# Patient Record
Sex: Male | Born: 1955 | Race: Black or African American | Hispanic: No | Marital: Single | Smoking: Current every day smoker
Health system: Southern US, Community
[De-identification: ages and names within clinical notes are randomized; demographics above are authoritative.]

---

## 2010-12-18 ENCOUNTER — Emergency Department: Payer: Self-pay | Admitting: Internal Medicine

## 2018-07-17 ENCOUNTER — Emergency Department
Admission: EM | Admit: 2018-07-17 | Discharge: 2018-07-17 | Discharge status: Against Medical Advice | Payer: Self-pay | Attending: Emergency Medicine | Admitting: Emergency Medicine

## 2018-07-17 ENCOUNTER — Emergency Department: Payer: Self-pay

## 2018-07-17 ENCOUNTER — Encounter: Payer: Self-pay | Admitting: Emergency Medicine

## 2018-07-17 DIAGNOSIS — R42 Dizziness and giddiness: Secondary | ICD-10-CM | POA: Insufficient documentation

## 2018-07-17 DIAGNOSIS — Z5321 Procedure and treatment not carried out due to patient leaving prior to being seen by health care provider: Secondary | ICD-10-CM | POA: Insufficient documentation

## 2018-07-17 LAB — BASIC METABOLIC PANEL
ANION GAP: 11 (ref 5–15)
BUN: 17 mg/dL (ref 8–23)
CHLORIDE: 106 mmol/L (ref 98–111)
CO2: 22 mmol/L (ref 22–32)
Calcium: 9.5 mg/dL (ref 8.9–10.3)
Creatinine, Ser: 0.89 mg/dL (ref 0.61–1.24)
GFR calc Af Amer: >60 mL/min (ref >60)
GLUCOSE: 122 mg/dL — AB (ref 70–99)
POTASSIUM: 4.1 mmol/L (ref 3.5–5.1)
Sodium: 139 mmol/L (ref 135–145)

## 2018-07-17 LAB — CBC
HEMATOCRIT: 54.1 % — AB (ref 40.0–52.0)
Hemoglobin: 18.2 g/dL — ABNORMAL HIGH (ref 13.0–18.0)
MCH: 30.4 pg (ref 26.0–34.0)
MCHC: 33.6 g/dL (ref 32.0–36.0)
MCV: 90.4 fL (ref 80.0–100.0)
PLATELETS: 202 10*3/uL (ref 150–440)
RBC: 5.98 MIL/uL — ABNORMAL HIGH (ref 4.40–5.90)
RDW: 14.8 % — AB (ref 11.5–14.5)
WBC: 5.3 10*3/uL (ref 3.8–10.6)

## 2018-07-17 LAB — GLUCOSE, CAPILLARY: GLUCOSE-CAPILLARY: 131 mg/dL — AB (ref 70–99)

## 2018-07-17 LAB — TROPONIN I: Troponin I: <0.03 ng/mL (ref <0.03)

## 2018-07-17 NOTE — ED Triage Notes (Signed)
First Nurse Note: C/O dizziness for the past 'few weeks".   AAOx3.  Skin warm and dry.  NAD.  MAE equally and strong.  Gait steady.

## 2018-07-17 NOTE — ED Triage Notes (Signed)
Pt arrives with complaints of dizziness for the last few weeks. Pt reports poor appetite. Pt also reports intermittent left sides chest pain.

## 2020-05-11 IMAGING — CR DG CHEST 2V
1 series · 2 of 2 positions shown · non-contrast
Comparison: None.

CLINICAL DATA: Dizziness for a few weeks, poor appetite.
Intermittent LEFT chest pain.

EXAM:
CHEST - 2 VIEW

[Series 1: dg chest 2 view · 0.14mm/px · 2 of 2 slices shown]
[im 1/2]
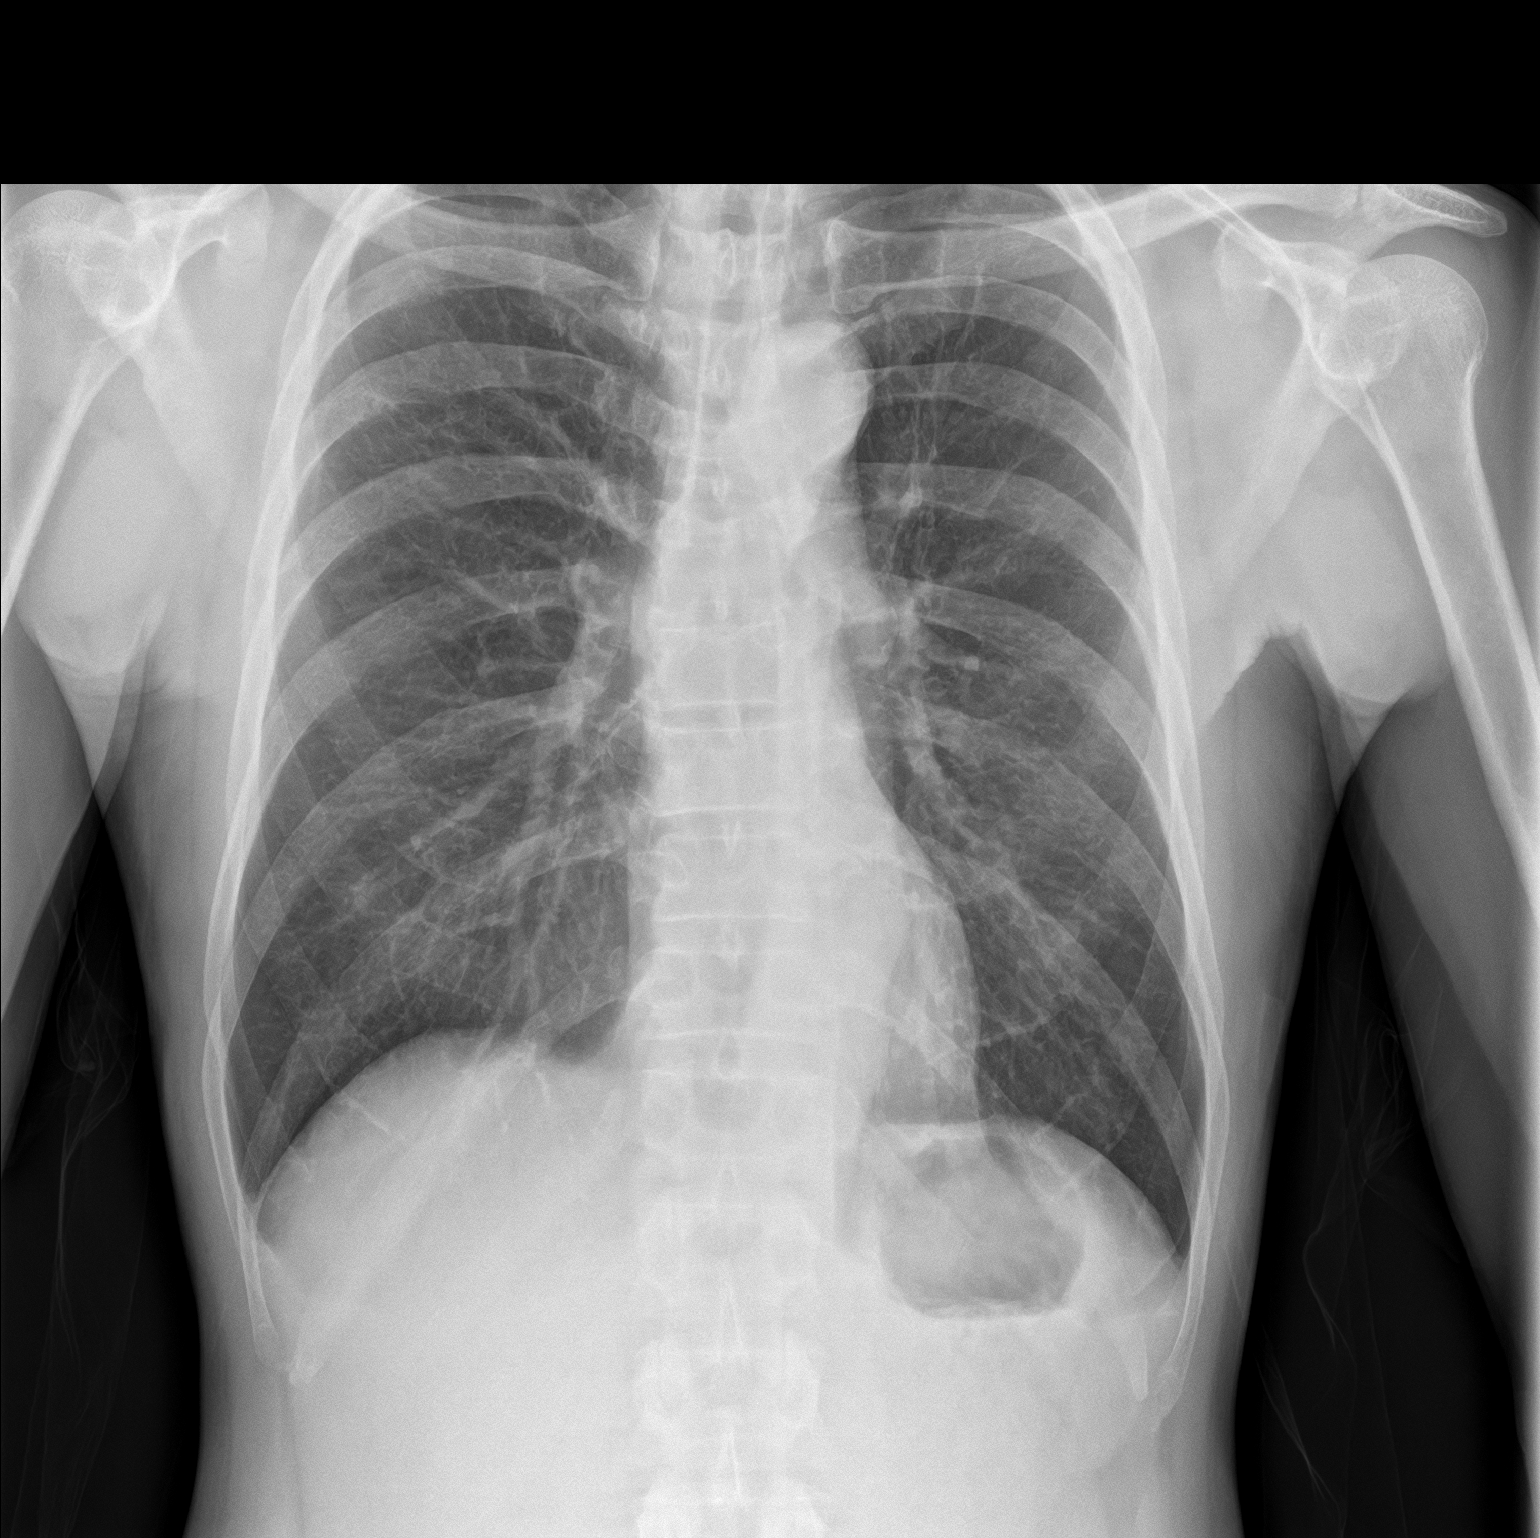
[im 2/2]
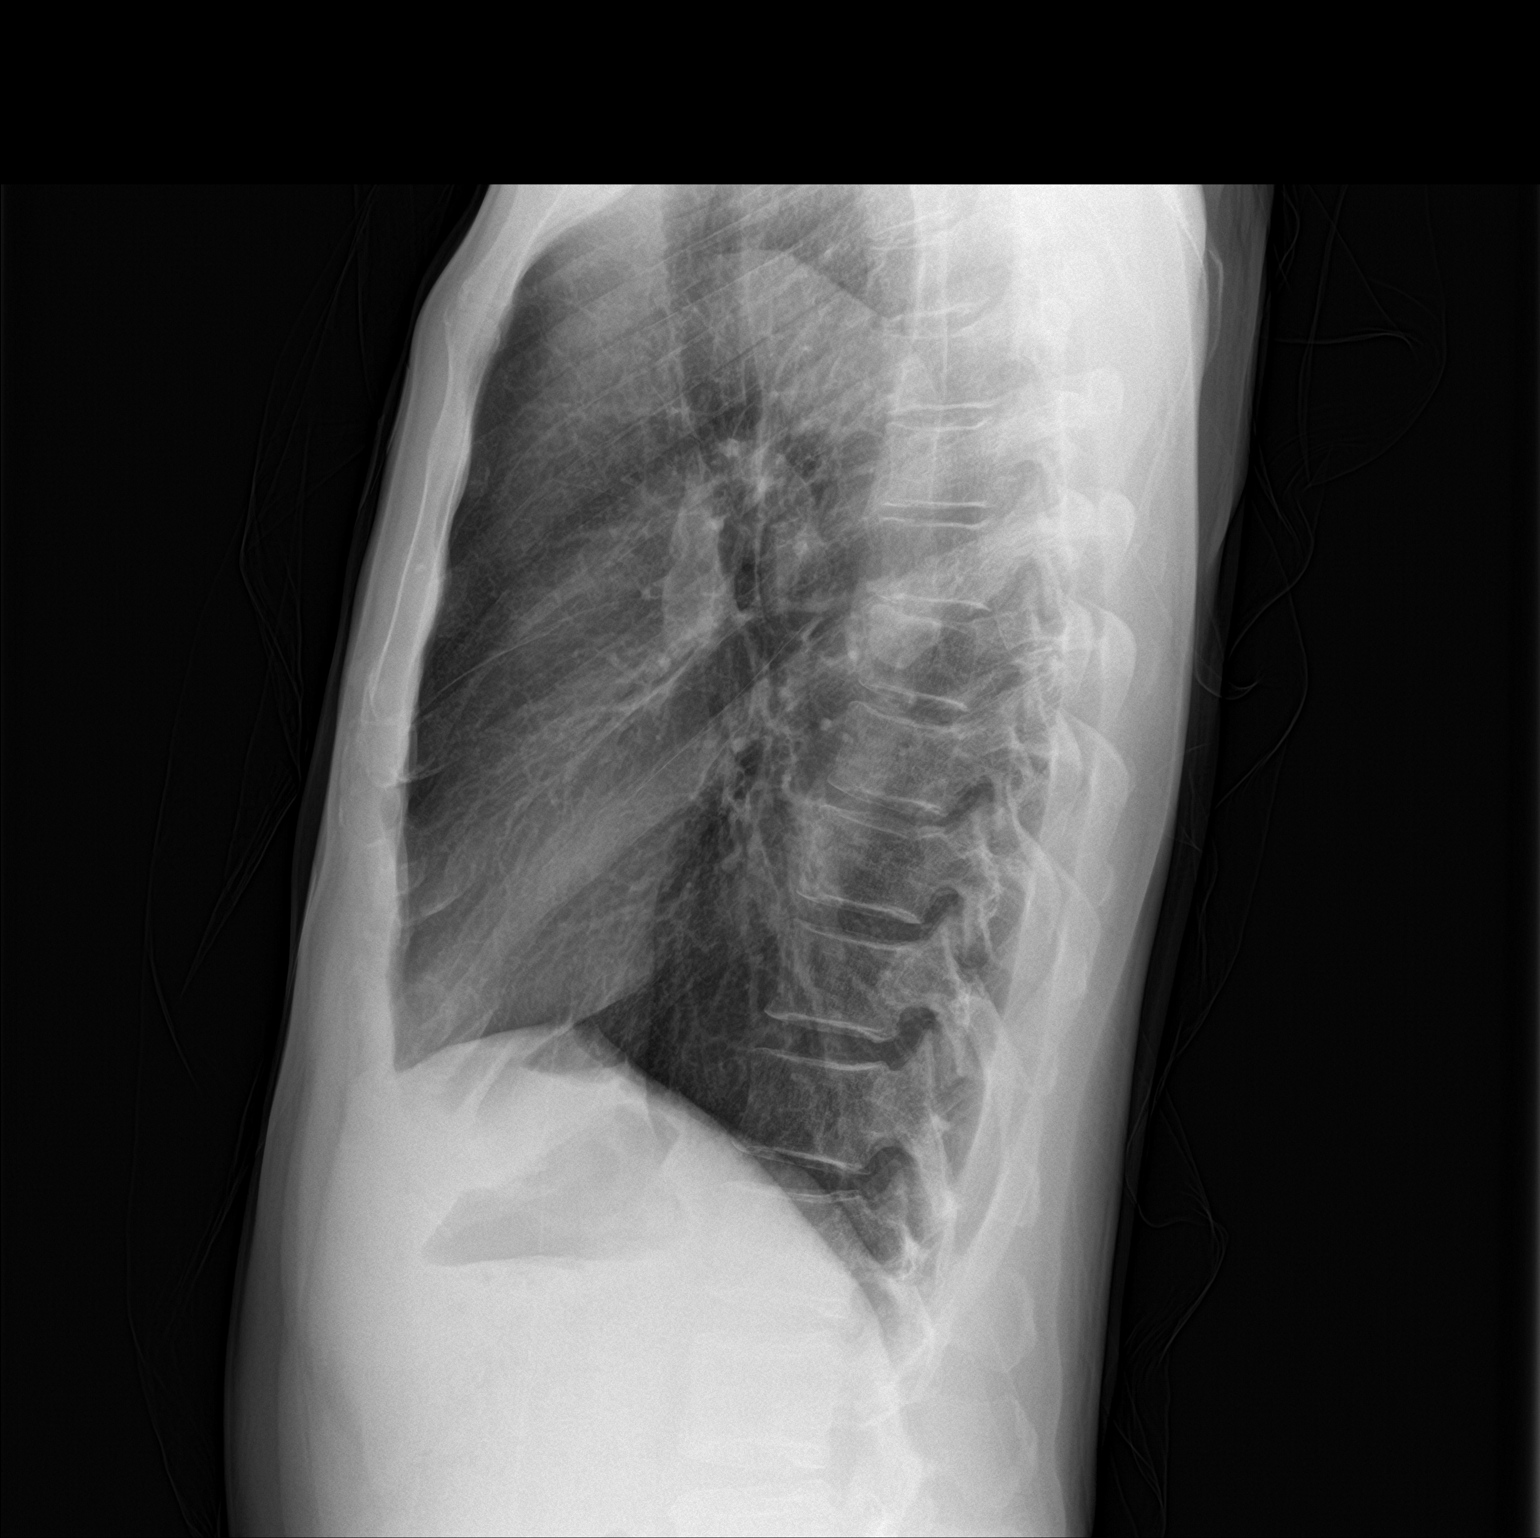

[2 of 2 positions shown; findings below may reference images not displayed]

FINDINGS: Cardiomediastinal silhouette is normal. No pleural effusions or
focal consolidations. Minimal RIGHT upper lobe atelectasis versus
scarring. Trachea projects midline and there is no pneumothorax.
Soft tissue planes and included osseous structures are
non-suspicious.
IMPRESSION: No active cardiopulmonary disease.

## 2021-12-31 DIAGNOSIS — H2513 Age-related nuclear cataract, bilateral: Secondary | ICD-10-CM | POA: Diagnosis not present

## 2021-12-31 DIAGNOSIS — Z0100 Encounter for examination of eyes and vision without abnormal findings: Secondary | ICD-10-CM | POA: Diagnosis not present

## 2021-12-31 DIAGNOSIS — I10 Essential (primary) hypertension: Secondary | ICD-10-CM | POA: Diagnosis not present

## 2021-12-31 DIAGNOSIS — H5203 Hypermetropia, bilateral: Secondary | ICD-10-CM | POA: Diagnosis not present

## 2022-03-28 NOTE — Progress Notes (Unsigned)
   There were no vitals taken for this visit.   Subjective:    Patient ID: Mitchell Whitaker, male    DOB: 12-Jan-1956, 66 y.o.   MRN: [id number]  HPI: Mitchell Whitaker is a 66 y.o. male  No chief complaint on file.  Patient presents to clinic to establish care with new PCP.  Introduced to Publishing rights manager role and practice setting.  All questions answered.  Discussed provider/patient relationship and expectations.  Patient reports a history of ***. Patient denies a history of: Hypertension, Elevated Cholesterol, Diabetes, Thyroid problems, Depression, Anxiety, Neurological problems, and Abdominal problems.   Active Ambulatory Problems    Diagnosis Date Noted   No Active Ambulatory Problems   Resolved Ambulatory Problems    Diagnosis Date Noted   No Resolved Ambulatory Problems   No Additional Past Medical History   No past surgical history on file. No family history on file.   Review of Systems  Per HPI unless specifically indicated above     Objective:    There were no vitals taken for this visit.  Wt Readings from Last 3 Encounters:  07/17/18 128 lb (58.1 kg)    Physical Exam  Results for orders placed or performed during the hospital encounter of 07/17/18  Glucose, capillary  Result Value Ref Range   Glucose-Capillary 131 (H) 70 - 99 mg/dL  Basic metabolic panel  Result Value Ref Range   Sodium 139 135 - 145 mmol/L   Potassium 4.1 3.5 - 5.1 mmol/L   Chloride 106 98 - 111 mmol/L   CO2 22 22 - 32 mmol/L   Glucose, Bld 122 (H) 70 - 99 mg/dL   BUN 17 8 - 23 mg/dL   Creatinine, Ser 6.83 0.61 - 1.24 mg/dL   Calcium 9.5 8.9 - 41.9 mg/dL   GFR calc non Af Amer >60 >60 mL/min   GFR calc Af Amer >60 >60 mL/min   Anion gap 11 5 - 15  CBC  Result Value Ref Range   WBC 5.3 3.8 - 10.6 K/uL   RBC 5.98 (H) 4.40 - 5.90 MIL/uL   Hemoglobin 18.2 (H) 13.0 - 18.0 g/dL   HCT 62.2 (H) 29.7 - 98.9 %   MCV 90.4 80.0 - 100.0 fL   MCH 30.4 26.0 - 34.0 pg   MCHC 33.6 32.0 - 36.0  g/dL   RDW 21.1 (H) 94.1 - 74.0 %   Platelets 202 150 - 440 K/uL  Troponin I  Result Value Ref Range   Troponin I <0.03 <0.03 ng/mL      Assessment & Plan:   Problem List Items Addressed This Visit   None Visit Diagnoses     Encounter to establish care    -  Primary        Follow up plan: No follow-ups on file.

## 2022-03-29 ENCOUNTER — Encounter: Payer: Self-pay | Admitting: Nurse Practitioner

## 2022-03-29 ENCOUNTER — Ambulatory Visit (INDEPENDENT_AMBULATORY_CARE_PROVIDER_SITE_OTHER): Payer: Medicare HMO | Admitting: Nurse Practitioner

## 2022-03-29 VITALS — BP 153/96 | HR 76 | Temp 98.5°F | Ht 69.29 in | Wt 144.8 lb

## 2022-03-29 DIAGNOSIS — N3943 Post-void dribbling: Secondary | ICD-10-CM

## 2022-03-29 DIAGNOSIS — Z7689 Persons encountering health services in other specified circumstances: Secondary | ICD-10-CM

## 2022-03-29 DIAGNOSIS — I10 Essential (primary) hypertension: Secondary | ICD-10-CM | POA: Insufficient documentation

## 2022-03-29 DIAGNOSIS — F17200 Nicotine dependence, unspecified, uncomplicated: Secondary | ICD-10-CM | POA: Diagnosis not present

## 2022-03-29 MED ORDER — AMLODIPINE BESYLATE 5 MG PO TABS: 5.0000 mg | ORAL_TABLET | Freq: Every day | ORAL | 0 refills | Status: DC

## 2022-03-29 NOTE — Assessment & Plan Note (Addendum)
Chronic. Not well controlled.  Will start Amlodipine 5mg  daily.  Side effects and benefits discussed during visit today.  Will draw labs at next visit.  Follow up in 1 month for reevaluation.  Patient requested referral to Cardiology due to family history of heart attack.

## 2022-03-29 NOTE — Assessment & Plan Note (Signed)
Chronic. Smokes about 1ppd.  Will need CT lung.  Patient is limited on time today. Will discuss next visit at his Physical.

## 2022-04-11 ENCOUNTER — Ambulatory Visit: Payer: Medicare HMO | Admitting: Urology

## 2022-04-19 ENCOUNTER — Encounter: Payer: Self-pay | Admitting: Urology

## 2022-05-08 NOTE — Progress Notes (Unsigned)
There were no vitals taken for this visit.   Subjective:    Patient ID: Mitchell Whitaker, male    DOB: 1956/08/16, 66 y.o.   MRN: [id number]  HPI: Mitchell Whitaker is a 66 y.o. male presenting on 05/09/2022 for comprehensive medical examination. Current medical complaints include:{Blank single:19197::"none","***"}  He currently lives with: Interim Problems from his last visit: {Blank single:19197::"yes","no"}  HYPERTENSION Hypertension status: {Blank single:19197::"controlled","uncontrolled","better","worse","exacerbated","stable"}  Satisfied with current treatment? {Blank single:19197::"yes","no"} Duration of hypertension: {Blank single:19197::"chronic","months","years"} BP monitoring frequency:  {Blank single:19197::"not checking","rarely","daily","weekly","monthly","a few times a day","a few times a week","a few times a month"} BP range:  BP medication side effects:  {Blank single:19197::"yes","no"} Medication compliance: {Blank single:19197::"excellent compliance","good compliance","fair compliance","poor compliance"} Previous BP meds:{Blank multiple:19196::"none","amlodipine","amlodipine/benazepril","atenolol","benazepril","benazepril/HCTZ","bisoprolol (bystolic)","carvedilol","chlorthalidone","clonidine","diltiazem","exforge HCT","HCTZ","irbesartan (avapro)","labetalol","lisinopril","lisinopril-HCTZ","losartan (cozaar)","methyldopa","nifedipine","olmesartan (benicar)","olmesartan-HCTZ","quinapril","ramipril","spironalactone","tekturna","valsartan","valsartan-HCTZ","verapamil"} Aspirin: {Blank single:19197::"yes","no"} Recurrent headaches: {Blank single:19197::"yes","no"} Visual changes: {Blank single:19197::"yes","no"} Palpitations: {Blank single:19197::"yes","no"} Dyspnea: {Blank single:19197::"yes","no"} Chest pain: {Blank single:19197::"yes","no"} Lower extremity edema: {Blank single:19197::"yes","no"} Dizzy/lightheaded: {Blank single:19197::"yes","no"}  Depression Screen done  today and results listed below:     03/29/2022    9:19 AM  Depression screen PHQ 2/9  Decreased Interest 1  Down, Depressed, Hopeless 1  PHQ - 2 Score 2  Altered sleeping 0  Tired, decreased energy 3  Change in appetite 0  Feeling bad or failure about yourself  0  Trouble concentrating 0  Moving slowly or fidgety/restless 0  Suicidal thoughts 0  PHQ-9 Score 5  Difficult doing work/chores Somewhat difficult    The patient {has/does not UDJS:97026} a history of falls. I {did/did not:19850} complete a risk assessment for falls. A plan of care for falls {was/was not:19852} documented.   Past Medical History:  No past medical history on file.  Surgical History:  No past surgical history on file.  Medications:  Current Outpatient Medications on File Prior to Visit  Medication Sig   amLODipine (NORVASC) 5 MG tablet Take 1 tablet (5 mg total) by mouth daily.   No current facility-administered medications on file prior to visit.    Allergies:  Not on File  Social History:  Social History   Socioeconomic History   Marital status: Single    Spouse name: Not on file   Number of children: Not on file   Years of education: Not on file   Highest education level: Not on file  Occupational History   Not on file  Tobacco Use   Smoking status: Every Day    Types: Cigars   Smokeless tobacco: Never  Vaping Use   Vaping Use: Never used  Substance and Sexual Activity   Alcohol use: Yes   Drug use: Never   Sexual activity: Yes  Other Topics Concern   Not on file  Social History Narrative   Not on file   Social Determinants of Health   Financial Resource Strain: Not on file  Food Insecurity: Not on file  Transportation Needs: Not on file  Physical Activity: Not on file  Stress: Not on file  Social Connections: Not on file  Intimate Partner Violence: Not on file   Social History   Tobacco Use  Smoking Status Every Day   Types: Cigars  Smokeless Tobacco Never    Social History   Substance and Sexual Activity  Alcohol Use Yes    Family History:  No family history on file.  Past medical history, surgical history, medications, allergies, family history and social history reviewed with patient today and changes made to appropriate areas of the chart.   ROS All other ROS negative except what is  listed above and in the HPI.      Objective:    There were no vitals taken for this visit.  Wt Readings from Last 3 Encounters:  03/29/22 144 lb 12.8 oz (65.7 kg)  07/17/18 128 lb (58.1 kg)    Physical Exam  Results for orders placed or performed during the hospital encounter of 07/17/18  Glucose, capillary  Result Value Ref Range   Glucose-Capillary 131 (H) 70 - 99 mg/dL  Basic metabolic panel  Result Value Ref Range   Sodium 139 135 - 145 mmol/L   Potassium 4.1 3.5 - 5.1 mmol/L   Chloride 106 98 - 111 mmol/L   CO2 22 22 - 32 mmol/L   Glucose, Bld 122 (H) 70 - 99 mg/dL   BUN 17 8 - 23 mg/dL   Creatinine, Ser 2.97 0.61 - 1.24 mg/dL   Calcium 9.5 8.9 - 98.9 mg/dL   GFR calc non Af Amer >60 >60 mL/min   GFR calc Af Amer >60 >60 mL/min   Anion gap 11 5 - 15  CBC  Result Value Ref Range   WBC 5.3 3.8 - 10.6 K/uL   RBC 5.98 (H) 4.40 - 5.90 MIL/uL   Hemoglobin 18.2 (H) 13.0 - 18.0 g/dL   HCT 21.1 (H) 94.1 - 74.0 %   MCV 90.4 80.0 - 100.0 fL   MCH 30.4 26.0 - 34.0 pg   MCHC 33.6 32.0 - 36.0 g/dL   RDW 81.4 (H) 48.1 - 85.6 %   Platelets 202 150 - 440 K/uL  Troponin I  Result Value Ref Range   Troponin I <0.03 <0.03 ng/mL      Assessment & Plan:   Problem List Items Addressed This Visit       Cardiovascular and Mediastinum   Primary hypertension   Other Visit Diagnoses     Annual physical exam    -  Primary        Discussed aspirin prophylaxis for myocardial infarction prevention and decision was {Blank single:19197::"it was not indicated","made to continue ASA","made to start ASA","made to stop ASA","that we recommended  ASA, and patient refused"}  LABORATORY TESTING:  Health maintenance labs ordered today as discussed above.   The natural history of prostate cancer and ongoing controversy regarding screening and potential treatment outcomes of prostate cancer has been discussed with the patient. The meaning of a false positive PSA and a false negative PSA has been discussed. He indicates understanding of the limitations of this screening test and wishes *** to proceed with screening PSA testing.   IMMUNIZATIONS:   - Tdap: Tetanus vaccination status reviewed: {tetanus status:315746}. - Influenza: {Blank single:19197::"Up to date","Administered today","Postponed to flu season","Refused","Given elsewhere"} - Pneumovax: {Blank single:19197::"Up to date","Administered today","Not applicable","Refused","Given elsewhere"} - Prevnar: {Blank single:19197::"Up to date","Administered today","Not applicable","Refused","Given elsewhere"} - COVID: {Blank single:19197::"Up to date","Administered today","Not applicable","Refused","Given elsewhere"} - HPV: {Blank single:19197::"Up to date","Administered today","Not applicable","Refused","Given elsewhere"} - Shingrix vaccine: {Blank single:19197::"Up to date","Administered today","Not applicable","Refused","Given elsewhere"}  SCREENING: - Colonoscopy: {Blank single:19197::"Up to date","Ordered today","Not applicable","Refused","Done elsewhere"}  Discussed with patient purpose of the colonoscopy is to detect colon cancer at curable precancerous or early stages   - AAA Screening: {Blank single:19197::"Up to date","Ordered today","Not applicable","Refused","Done elsewhere"}  -Hearing Test: {Blank single:19197::"Up to date","Ordered today","Not applicable","Refused","Done elsewhere"}  -Spirometry: {Blank single:19197::"Up to date","Ordered today","Not applicable","Refused","Done elsewhere"}   PATIENT COUNSELING:    Sexuality: Discussed sexually transmitted diseases, partner  selection, use of condoms, avoidance of unintended pregnancy  and contraceptive alternatives.   Advised to avoid cigarette  smoking.  I discussed with the patient that most people either abstain from alcohol or drink within safe limits (<=14/week and <=4 drinks/occasion for males, <=7/weeks and <= 3 drinks/occasion for females) and that the risk for alcohol disorders and other health effects rises proportionally with the number of drinks per week and how often a drinker exceeds daily limits.  Discussed cessation/primary prevention of drug use and availability of treatment for abuse.   Diet: Encouraged to adjust caloric intake to maintain  or achieve ideal body weight, to reduce intake of dietary saturated fat and total fat, to limit sodium intake by avoiding high sodium foods and not adding table salt, and to maintain adequate dietary potassium and calcium preferably from fresh fruits, vegetables, and low-fat dairy products.    stressed the importance of regular exercise  Injury prevention: Discussed safety belts, safety helmets, smoke detector, smoking near bedding or upholstery.   Dental health: Discussed importance of regular tooth brushing, flossing, and dental visits.   Follow up plan: NEXT PREVENTATIVE PHYSICAL DUE IN 1 YEAR. No follow-ups on file.

## 2022-05-09 ENCOUNTER — Ambulatory Visit (INDEPENDENT_AMBULATORY_CARE_PROVIDER_SITE_OTHER): Payer: Medicare HMO | Admitting: Nurse Practitioner

## 2022-05-09 ENCOUNTER — Telehealth: Payer: Self-pay

## 2022-05-09 ENCOUNTER — Encounter: Payer: Self-pay | Admitting: Nurse Practitioner

## 2022-05-09 ENCOUNTER — Other Ambulatory Visit: Payer: Self-pay

## 2022-05-09 VITALS — BP 120/78 | HR 65 | Temp 97.7°F | Ht 69.78 in | Wt 139.2 lb

## 2022-05-09 DIAGNOSIS — R7309 Other abnormal glucose: Secondary | ICD-10-CM

## 2022-05-09 DIAGNOSIS — R0602 Shortness of breath: Secondary | ICD-10-CM | POA: Diagnosis not present

## 2022-05-09 DIAGNOSIS — Z1211 Encounter for screening for malignant neoplasm of colon: Secondary | ICD-10-CM

## 2022-05-09 DIAGNOSIS — I10 Essential (primary) hypertension: Secondary | ICD-10-CM | POA: Diagnosis not present

## 2022-05-09 DIAGNOSIS — Z0000 Encounter for general adult medical examination without abnormal findings: Secondary | ICD-10-CM

## 2022-05-09 DIAGNOSIS — F17200 Nicotine dependence, unspecified, uncomplicated: Secondary | ICD-10-CM

## 2022-05-09 DIAGNOSIS — Z136 Encounter for screening for cardiovascular disorders: Secondary | ICD-10-CM | POA: Diagnosis not present

## 2022-05-09 DIAGNOSIS — Z1159 Encounter for screening for other viral diseases: Secondary | ICD-10-CM

## 2022-05-09 LAB — URINALYSIS, ROUTINE W REFLEX MICROSCOPIC
Bilirubin, UA: NEGATIVE
Glucose, UA: NEGATIVE
Ketones, UA: NEGATIVE
Leukocytes,UA: NEGATIVE
Nitrite, UA: NEGATIVE
Protein,UA: NEGATIVE
RBC, UA: NEGATIVE
Specific Gravity, UA: 1.02 (ref 1.005–1.030)
Urobilinogen, Ur: 0.2 mg/dL (ref 0.2–1.0)
pH, UA: 5.5 (ref 5.0–7.5)

## 2022-05-09 MED ORDER — PEG 3350-KCL-NA BICARB-NACL 420 G PO SOLR: ORAL | 0 refills | Status: DC

## 2022-05-09 NOTE — Assessment & Plan Note (Signed)
Chronic. Intermittent.  Suspect COPD due to history of smoking for 40+years. Not symptomatic in office today. Has appt with Cardiology on 7/27.  Will follow up in 3 months and do spirometry at visit.

## 2022-05-09 NOTE — Assessment & Plan Note (Signed)
Chronic. Improved at visit today.  States he never started the Amlodipine because the pharmacy didn't receive it.  Blood pressure improved today. Will hold off on starting medication. Labs ordered today. Will make recommendations based on lab results. Follow up in 3 months call sooner if concerns arise.

## 2022-05-09 NOTE — Telephone Encounter (Signed)
Gastroenterology Pre-Procedure Review  Request Date: 06/19/22 Requesting Physician: Dr. Tobi Bastos  PATIENT REVIEW QUESTIONS: The patient responded to the following health history questions as indicated:    1. Are you having any GI issues? no 2. Do you have a personal history of Polyps? no 3. Do you have a family history of Colon Cancer or Polyps? no 4. Diabetes Mellitus? no 5. Joint replacements in the past 12 months?no 6. Major health problems in the past 3 months?no 7. Any artificial heart valves, MVP, or defibrillator?no    MEDICATIONS & ALLERGIES:    Patient reports the following regarding taking any anticoagulation/antiplatelet therapy:   Plavix, Coumadin, Eliquis, Xarelto, Lovenox, Pradaxa, Brilinta, or Effient? no Aspirin? no  Patient confirms/reports the following medications:  Current Outpatient Medications  Medication Sig Dispense Refill   amLODipine (NORVASC) 5 MG tablet Take 1 tablet (5 mg total) by mouth daily. (Patient not taking: Reported on 05/09/2022) 30 tablet 0   No current facility-administered medications for this visit.    Patient confirms/reports the following allergies:  Not on File  No orders of the defined types were placed in this encounter.   AUTHORIZATION INFORMATION Primary Insurance: 1D#: Group #:  Secondary Insurance: 1D#: Group #:  SCHEDULE INFORMATION: Date: 06/19/22 Time: Location: [facility]

## 2022-05-09 NOTE — Assessment & Plan Note (Signed)
Started smoking 40 years ago.  Smokes about 1/2 ppd of cigars. Low lose lung CT ordered.

## 2022-05-10 LAB — COMPREHENSIVE METABOLIC PANEL
ALT: 26 IU/L (ref 0–44)
AST: 24 IU/L (ref 0–40)
Albumin/Globulin Ratio: 1.7 (ref 1.2–2.2)
Albumin: 4.5 g/dL (ref 3.9–4.9)
Alkaline Phosphatase: 83 IU/L (ref 44–121)
BUN/Creatinine Ratio: 16 (ref 10–24)
BUN: 15 mg/dL (ref 8–27)
Bilirubin Total: 0.9 mg/dL (ref 0.0–1.2)
CO2: 25 mmol/L (ref 20–29)
Calcium: 9.8 mg/dL (ref 8.6–10.2)
Chloride: 100 mmol/L (ref 96–106)
Creatinine, Ser: 0.95 mg/dL (ref 0.76–1.27)
Globulin, Total: 2.7 g/dL (ref 1.5–4.5)
Glucose: 128 mg/dL — ABNORMAL HIGH (ref 70–99)
Potassium: 4.5 mmol/L (ref 3.5–5.2)
Sodium: 140 mmol/L (ref 134–144)
Total Protein: 7.2 g/dL (ref 6.0–8.5)
eGFR: 88 mL/min/{1.73_m2} (ref >59)

## 2022-05-10 LAB — CBC WITH DIFFERENTIAL/PLATELET
Basophils Absolute: 0 10*3/uL (ref 0.0–0.2)
Basos: 1 %
EOS (ABSOLUTE): 0.2 10*3/uL (ref 0.0–0.4)
Eos: 3 %
Hematocrit: 52.9 % — ABNORMAL HIGH (ref 37.5–51.0)
Hemoglobin: 17.7 g/dL (ref 13.0–17.7)
Immature Grans (Abs): 0 10*3/uL (ref 0.0–0.1)
Immature Granulocytes: 0 %
Lymphocytes Absolute: 2.3 10*3/uL (ref 0.7–3.1)
Lymphs: 45 %
MCH: 28.7 pg (ref 26.6–33.0)
MCHC: 33.5 g/dL (ref 31.5–35.7)
MCV: 86 fL (ref 79–97)
Monocytes Absolute: 0.6 10*3/uL (ref 0.1–0.9)
Monocytes: 10 %
Neutrophils Absolute: 2.2 10*3/uL (ref 1.4–7.0)
Neutrophils: 41 %
Platelets: 225 10*3/uL (ref 150–450)
RBC: 6.16 x10E6/uL — ABNORMAL HIGH (ref 4.14–5.80)
RDW: 15 % (ref 11.6–15.4)
WBC: 5.3 10*3/uL (ref 3.4–10.8)

## 2022-05-10 LAB — LIPID PANEL
Chol/HDL Ratio: 3.6 ratio (ref 0.0–5.0)
Cholesterol, Total: 225 mg/dL — ABNORMAL HIGH (ref 100–199)
HDL: 62 mg/dL (ref >39)
LDL Chol Calc (NIH): 131 mg/dL — ABNORMAL HIGH (ref 0–99)
Triglycerides: 180 mg/dL — ABNORMAL HIGH (ref 0–149)
VLDL Cholesterol Cal: 32 mg/dL (ref 5–40)

## 2022-05-10 LAB — TSH: TSH: 1.18 u[IU]/mL (ref 0.450–4.500)

## 2022-05-10 LAB — HEPATITIS C ANTIBODY: Hep C Virus Ab: NONREACTIVE

## 2022-05-10 LAB — PSA: Prostate Specific Ag, Serum: 3.1 ng/mL (ref 0.0–4.0)

## 2022-05-10 MED ORDER — ROSUVASTATIN CALCIUM 5 MG PO TABS: 5.0000 mg | ORAL_TABLET | Freq: Every day | ORAL | 1 refills | Status: DC

## 2022-05-10 NOTE — Addendum Note (Signed)
Addended by: Larae Grooms on: 05/10/2022 08:01 AM   Modules accepted: Orders

## 2022-05-10 NOTE — Progress Notes (Signed)
Please let patient know that his cholesterol is elevated.  His cardiac risk score puts him at high risk of having a stroke or heart attack over the next 10 years.  I recommend that he start a statin called crestor 5mg  daily.  The goal will be to increase this to 20mg  daily if patient tolerates it well.  If he agrees to the medication I can send it to the pharmacy.    His sugar is elevated.  I am going to check an A1c which is the measure of diabetes.   I believe the lab can add this on to his tests from yesterday. Otherwise, his lab work looks good.  Follow up as discussed.    The 10-year ASCVD risk score (Arnett DK, et al., 2019) is: 24.5%   Values used to calculate the score:     Age: 66 years     Sex: Male     Is Non-Hispanic African American: Yes     Diabetic: No     Tobacco smoker: Yes     Systolic Blood Pressure: 120 mmHg     Is BP treated: Yes     HDL Cholesterol: 62 mg/dL     Total Cholesterol: 225 mg/dL

## 2022-05-10 NOTE — Progress Notes (Signed)
Medication sent to the pharmacy.

## 2022-05-10 NOTE — Addendum Note (Signed)
Addended by: Larae Grooms on: 05/10/2022 02:57 PM   Modules accepted: Orders

## 2022-05-11 LAB — HEMOGLOBIN A1C
Est. average glucose Bld gHb Est-mCnc: 131 mg/dL
Hgb A1c MFr Bld: 6.2 % — ABNORMAL HIGH (ref 4.8–5.6)

## 2022-05-11 LAB — SPECIMEN STATUS REPORT

## 2022-05-13 NOTE — Progress Notes (Signed)
Please let patient know that he is prediabetic. No medications at this time.  I recommend he follow a low carb diet and exercise.  No other concerns at this time.

## 2022-05-23 ENCOUNTER — Ambulatory Visit: Payer: Medicare HMO | Admitting: Cardiology

## 2022-05-23 ENCOUNTER — Encounter: Payer: Self-pay | Admitting: Cardiology

## 2022-06-19 ENCOUNTER — Ambulatory Visit: Admission: RE | Admit: 2022-06-19 | Payer: Medicare HMO | Source: Home / Self Care | Admitting: Gastroenterology

## 2022-06-19 ENCOUNTER — Encounter: Admission: RE | Payer: Self-pay | Source: Home / Self Care

## 2022-06-19 SURGERY — COLONOSCOPY WITH PROPOFOL
Anesthesia: General

## 2022-08-08 NOTE — Progress Notes (Deleted)
There were no vitals taken for this visit.   Subjective:    Patient ID: Mitchell Whitaker, male    DOB: [birth year], 66 y.o.   MRN: [id number]  HPI: Mitchell Whitaker is a 65 y.o. male  No chief complaint on file.  HYPERTENSION {Blank single:19197::"without","with"} Chronic Kidney Disease Hypertension status: {Blank single:19197::"controlled","uncontrolled","better","worse","exacerbated","stable"}  Satisfied with current treatment? {Blank single:19197::"yes","no"} Duration of hypertension: {Blank single:19197::"chronic","months","years"} BP monitoring frequency:  {Blank single:19197::"not checking","rarely","daily","weekly","monthly","a few times a day","a few times a week","a few times a month"} BP range:  BP medication side effects:  {Blank single:19197::"yes","no"} Medication compliance: {Blank single:19197::"excellent compliance","good compliance","fair compliance","poor compliance"} Previous BP meds:{Blank OYDXAJOI:78676::"HMCN","OBSJGGEZMO","QHUTMLYYTK/PTWSFKCLEX","NTZGYFVC","BSWHQPRFFM","BWGYKZLDJT/TSVX","BLTJQZESPQ (bystolic)","carvedilol","chlorthalidone","clonidine","diltiazem","exforge HCT","HCTZ","irbesartan (avapro)","labetalol","lisinopril","lisinopril-HCTZ","losartan (cozaar)","methyldopa","nifedipine","olmesartan (benicar)","olmesartan-HCTZ","quinapril","ramipril","spironalactone","tekturna","valsartan","valsartan-HCTZ","verapamil"} Aspirin: {Blank single:19197::"yes","no"} Recurrent headaches: {Blank single:19197::"yes","no"} Visual changes: {Blank single:19197::"yes","no"} Palpitations: {Blank single:19197::"yes","no"} Dyspnea: {Blank single:19197::"yes","no"} Chest pain: {Blank single:19197::"yes","no"} Lower extremity edema: {Blank single:19197::"yes","no"} Dizzy/lightheaded: {Blank single:19197::"yes","no"}  SHORTNESS OF BREATH Duration:  Onset: {Blank single:19197::"sudden","gradual"} Description of breathing discomfort:  Severity: {Blank  single:19197::"mild","moderate","severe"} Episode duration:  Frequency: Related to exertion: {Blank single:19197::"yes","no"} Cough: {Blank multiple:19196::"yes","yes productive","yes non-productive","no"} Chest tightness: {Blank single:19197::"yes","no"} Wheezing: {Blank single:19197::"yes","no"} Fevers: {Blank single:19197::"yes","no"} Chest pain: {Blank single:19197::"yes","no"} Palpitations: {Blank single:19197::"yes","no"}  Nausea: {Blank single:19197::"yes","no"} Diaphoresis: {Blank single:19197::"yes","no"} Deconditioning: {Blank single:19197::"yes","no"} Status: {Blank multiple:19196::"better","worse","stable","fluctuating"} Aggravating factors:  Alleviating factors:  Treatments attempted:   Relevant past medical, surgical, family and social history reviewed and updated as indicated. Interim medical history since our last visit reviewed. Allergies and medications reviewed and updated.  Review of Systems  Per HPI unless specifically indicated above     Objective:    There were no vitals taken for this visit.  Wt Readings from Last 3 Encounters:  05/09/22 139 lb 3.2 oz (63.1 kg)  03/29/22 144 lb 12.8 oz (65.7 kg)  07/17/18 128 lb (58.1 kg)    Physical Exam  Results for orders placed or performed in visit on 05/09/22  Hepatitis C Antibody  Result Value Ref Range   Hep C Virus Ab Non Reactive Non Reactive  TSH  Result Value Ref Range   TSH 1.180 0.450 - 4.500 uIU/mL  PSA  Result Value Ref Range   Prostate Specific Ag, Serum 3.1 0.0 - 4.0 ng/mL  Lipid panel  Result Value Ref Range   Cholesterol, Total 225 (H) 100 - 199 mg/dL   Triglycerides 180 (H) 0 - 149 mg/dL   HDL 62 >39 mg/dL   VLDL Cholesterol Cal 32 5 - 40 mg/dL   LDL Chol Calc (NIH) 131 (H) 0 - 99 mg/dL   Chol/HDL Ratio 3.6 0.0 - 5.0 ratio  CBC with Differential/Platelet  Result Value Ref Range   WBC 5.3 3.4 - 10.8 x10E3/uL   RBC 6.16 (H) 4.14 - 5.80 x10E6/uL   Hemoglobin 17.7 13.0 - 17.7 g/dL    Hematocrit 52.9 (H) 37.5 - 51.0 %   MCV 86 79 - 97 fL   MCH 28.7 26.6 - 33.0 pg   MCHC 33.5 31.5 - 35.7 g/dL   RDW 15.0 11.6 - 15.4 %   Platelets 225 150 - 450 x10E3/uL   Neutrophils 41 Not Estab. %   Lymphs 45 Not Estab. %   Monocytes 10 Not Estab. %   Eos 3 Not Estab. %   Basos 1 Not Estab. %   Neutrophils Absolute 2.2 1.4 - 7.0 x10E3/uL   Lymphocytes Absolute 2.3 0.7 - 3.1 x10E3/uL   Monocytes Absolute 0.6 0.1 - 0.9 x10E3/uL   EOS (ABSOLUTE) 0.2 0.0 - 0.4 x10E3/uL   Basophils Absolute 0.0 0.0 - 0.2 x10E3/uL   Immature Granulocytes 0  Not Estab. %   Immature Grans (Abs) 0.0 0.0 - 0.1 x10E3/uL  Comprehensive metabolic panel  Result Value Ref Range   Glucose 128 (H) 70 - 99 mg/dL   BUN 15 8 - 27 mg/dL   Creatinine, Ser 0.95 0.76 - 1.27 mg/dL   eGFR 88 >59 mL/min/1.73   BUN/Creatinine Ratio 16 10 - 24   Sodium 140 134 - 144 mmol/L   Potassium 4.5 3.5 - 5.2 mmol/L   Chloride 100 96 - 106 mmol/L   CO2 25 20 - 29 mmol/L   Calcium 9.8 8.6 - 10.2 mg/dL   Total Protein 7.2 6.0 - 8.5 g/dL   Albumin 4.5 3.9 - 4.9 g/dL   Globulin, Total 2.7 1.5 - 4.5 g/dL   Albumin/Globulin Ratio 1.7 1.2 - 2.2   Bilirubin Total 0.9 0.0 - 1.2 mg/dL   Alkaline Phosphatase 83 44 - 121 IU/L   AST 24 0 - 40 IU/L   ALT 26 0 - 44 IU/L  Urinalysis, Routine w reflex microscopic  Result Value Ref Range   Specific Gravity, UA 1.020 1.005 - 1.030   pH, UA 5.5 5.0 - 7.5   Color, UA Yellow Yellow   Appearance Ur Clear Clear   Leukocytes,UA Negative Negative   Protein,UA Negative Negative/Trace   Glucose, UA Negative Negative   Ketones, UA Negative Negative   RBC, UA Negative Negative   Bilirubin, UA Negative Negative   Urobilinogen, Ur 0.2 0.2 - 1.0 mg/dL   Nitrite, UA Negative Negative  Hemoglobin A1c  Result Value Ref Range   Hgb A1c MFr Bld 6.2 (H) 4.8 - 5.6 %   Est. average glucose Bld gHb Est-mCnc 131 mg/dL  Specimen status report  Result Value Ref Range   specimen status report Comment        Assessment & Plan:   Problem List Items Addressed This Visit       Cardiovascular and Mediastinum   Primary hypertension - Primary     Follow up plan: No follow-ups on file.      

## 2022-08-08 NOTE — Progress Notes (Signed)
BP 124/81   Pulse 76   Temp 97.6 F (36.4 C) (Oral)   Wt 143 lb 12.8 oz (65.2 kg)   SpO2 97%   BMI 20.76 kg/m    Subjective:    Patient ID: Mitchell Whitaker, male    DOB: 1956-08-25, 66 y.o.   MRN: [id number]  HPI: Mitchell Whitaker is a 66 y.o. male presenting on 08/09/2022 for comprehensive medical examination. Current medical complaints include: COPD  He currently lives with: Interim Problems from his last visit: no  HYPERTENSION with Chronic Kidney Disease Hypertension status: uncontrolled  Satisfied with current treatment? no Duration of hypertension: years BP monitoring frequency:  not checking BP range:  BP medication side effects:  no Medication compliance: excellent compliance Previous BP meds:amlodipine Aspirin: no Recurrent headaches: no Visual changes: no Palpitations: no Dyspnea: yes Chest pain: no Lower extremity edema: no Dizzy/lightheaded: no  COPD COPD status: uncontrolled Satisfied with current treatment?: no Oxygen use: no Dyspnea frequency: everyday Cough frequency: not very often Rescue inhaler frequency:  doesn't have one Limitation of activity: yes Productive cough:  Last Spirometry: 08/09/2022 Pneumovax: Up to Date Influenza: Not up to Date   Functional Status Survey:    FALL RISK:    08/09/2022   10:03 AM 05/09/2022    9:16 AM 03/29/2022    9:18 AM  Fall Risk   Falls in the past year? 0 0 0  Number falls in past yr: 0 0 0  Injury with Fall? 0 0 0  Risk for fall due to : No Fall Risks No Fall Risks No Fall Risks  Follow up Falls evaluation completed Falls evaluation completed Falls evaluation completed    Depression Screen    08/09/2022   10:03 AM 05/09/2022    9:16 AM 03/29/2022    9:19 AM  Depression screen PHQ 2/9  Decreased Interest 2 2 1   Down, Depressed, Hopeless 3 0 1  PHQ - 2 Score 5 2 2   Altered sleeping 3 0 0  Tired, decreased energy 3 3 3   Change in appetite 2 2 0  Feeling bad or failure about yourself  0 0 0   Trouble concentrating 0 0 0  Moving slowly or fidgety/restless 0 0 0  Suicidal thoughts 0 0 0  PHQ-9 Score 13 7 5   Difficult doing work/chores  Somewhat difficult Somewhat difficult    Advanced Directives Discussed at visit today.  Thinks he would do his niece but needs to think about.   Past Medical History:  History reviewed. No pertinent past medical history.  Surgical History:  History reviewed. No pertinent surgical history.  Medications:  No current outpatient medications on file prior to visit.   No current facility-administered medications on file prior to visit.    Allergies:  Not on File  Social History:  Social History   Socioeconomic History   Marital status: Single    Spouse name: Not on file   Number of children: Not on file   Years of education: Not on file   Highest education level: Not on file  Occupational History   Not on file  Tobacco Use   Smoking status: Every Day    Types: Cigars   Smokeless tobacco: Never  Vaping Use   Vaping Use: Never used  Substance and Sexual Activity   Alcohol use: Yes   Drug use: Never   Sexual activity: Yes  Other Topics Concern   Not on file  Social History Narrative   Not on file  Social Determinants of Health   Financial Resource Strain: Not on file  Food Insecurity: Not on file  Transportation Needs: Not on file  Physical Activity: Not on file  Stress: Not on file  Social Connections: Not on file  Intimate Partner Violence: Not on file   Social History   Tobacco Use  Smoking Status Every Day   Types: Cigars  Smokeless Tobacco Never   Social History   Substance and Sexual Activity  Alcohol Use Yes    Family History:  History reviewed. No pertinent family history.  Past medical history, surgical history, medications, allergies, family history and social history reviewed with patient today and changes made to appropriate areas of the chart.   Review of Systems  Eyes:  Negative for  blurred vision and double vision.  Respiratory:  Positive for shortness of breath.   Cardiovascular:  Negative for chest pain, palpitations and leg swelling.  Neurological:  Negative for dizziness and headaches.   All other ROS negative except what is listed above and in the HPI.      Objective:    BP 124/81   Pulse 76   Temp 97.6 F (36.4 C) (Oral)   Wt 143 lb 12.8 oz (65.2 kg)   SpO2 97%   BMI 20.76 kg/m   Wt Readings from Last 3 Encounters:  08/09/22 143 lb 12.8 oz (65.2 kg)  05/09/22 139 lb 3.2 oz (63.1 kg)  03/29/22 144 lb 12.8 oz (65.7 kg)    No results found.  Physical Exam Vitals and nursing note reviewed.  Constitutional:      General: He is not in acute distress.    Appearance: Normal appearance. He is not ill-appearing, toxic-appearing or diaphoretic.  HENT:     Head: Normocephalic.     Right Ear: Tympanic membrane, ear canal and external ear normal.     Left Ear: Tympanic membrane, ear canal and external ear normal.     Nose: Nose normal. No congestion or rhinorrhea.     Mouth/Throat:     Mouth: Mucous membranes are moist.  Eyes:     General:        Right eye: No discharge.        Left eye: No discharge.     Extraocular Movements: Extraocular movements intact.     Conjunctiva/sclera: Conjunctivae normal.     Pupils: Pupils are equal, round, and reactive to light.  Cardiovascular:     Rate and Rhythm: Normal rate and regular rhythm.     Heart sounds: No murmur heard. Pulmonary:     Effort: Pulmonary effort is normal. No respiratory distress.     Breath sounds: Normal breath sounds. No wheezing, rhonchi or rales.  Abdominal:     General: Abdomen is flat. Bowel sounds are normal. There is no distension.     Palpations: Abdomen is soft.     Tenderness: There is no abdominal tenderness. There is no guarding.  Musculoskeletal:     Cervical back: Normal range of motion and neck supple.  Skin:    General: Skin is warm and dry.     Capillary Refill:  Capillary refill takes less than 2 seconds.  Neurological:     General: No focal deficit present.     Mental Status: He is alert and oriented to person, place, and time.     Cranial Nerves: No cranial nerve deficit.     Motor: No weakness.     Deep Tendon Reflexes: Reflexes normal.  Psychiatric:  Mood and Affect: Mood normal.        Behavior: Behavior normal.        Thought Content: Thought content normal.        Judgment: Judgment normal.         No data to display          Cognitive Testing - 6-CIT  Correct? Score   What year is it? yes 0 Yes = 0    No = 4  What month is it? yes 0 Yes = 0    No = 3  Remember:     [place] [address]ins, [address], [place]     What time is it? yes 0 Yes = 0    No = 3  Count backwards from 20 to 1 yes 0 Correct = 0    1 error = 2   More than 1 error = 4  Say the months of the year in reverse. yes 0 Correct = 0    1 error = 2   More than 1 error = 4  What address did I ask you to remember? yes 0 Correct = 0  1 error = 2    2 error = 4    3 error = 6    4 error = 8    All wrong = 10       TOTAL SCORE  0/28   Interpretation:  Normal  Normal (0-7) Abnormal (8-28)    Results for orders placed or performed in visit on 08/09/22  Urinalysis, Routine w reflex microscopic  Result Value Ref Range   Specific Gravity, UA 1.025 1.005 - 1.030   pH, UA 6.0 5.0 - 7.5   Color, UA Yellow Yellow   Appearance Ur Clear Clear   Leukocytes,UA Negative Negative   Protein,UA Trace (A) Negative/Trace   Glucose, UA Negative Negative   Ketones, UA 1+ (A) Negative   RBC, UA Negative Negative   Bilirubin, UA Negative Negative   Urobilinogen, Ur 1.0 0.2 - 1.0 mg/dL   Nitrite, UA Negative Negative      Assessment & Plan:   Problem List Items Addressed This Visit       Cardiovascular and Mediastinum   Primary hypertension    Chronic.  Still elevated.  Will increase Amlodipine to 10mg .  Follow up in 1 month.  Call sooner if concerns arise.       Relevant  Medications   amLODipine (NORVASC) 10 MG tablet   rosuvastatin (CRESTOR) 5 MG tablet     Respiratory   Chronic obstructive pulmonary disease (HCC)    New diagnosis today.  Spirometry done in office and FEV1 70% on post Spirometry.  Will start Spiriva and albuterol given.  Instructed patient on how to properly use medications, side effects, benefits.  Follow up in 1 month for reevaluation.  Call sooner if concerns arise.  Declines CT Lung screening at this time due to transportation.      Relevant Medications   Tiotropium Bromide Monohydrate (SPIRIVA RESPIMAT) 2.5 MCG/ACT AERS   albuterol (VENTOLIN HFA) 108 (90 Base) MCG/ACT inhaler     Other   Shortness of breath   Relevant Orders   Spirometry with graph (Completed)   Encounter for counseling regarding advance directives    A voluntary discussion about advance care planning including the explanation and discussion of advance directives was extensively discussed  with the patient for 10 minutes with patient.  Explanation about the health care proxy and  Living will was reviewed and packet with forms with explanation of how to fill them out was given.  During this discussion, the patient was able to identify a health care proxy as his neice and plans/does not plan to fill out the paperwork required.  Patient was offered a separate Advance Care Planning visit for further assistance with forms.         Prediabetes    Labs ordered at visit today.  Will make recommendations based on lab results.        Relevant Orders   HgB A1c   Mixed hyperlipidemia    Chronic.  Did not start the Crestor.  Agrees to start medication.  Side effects discussed with patient during visit.  Labs ordered today.  Follow up in 6 months.  Call sooner if concerns arise.       Relevant Medications   amLODipine (NORVASC) 10 MG tablet   rosuvastatin (CRESTOR) 5 MG tablet   Other Relevant Orders   Lipid panel   Other Visit Diagnoses     Annual physical exam    -   Primary   Health maintenance reviewed during visit today.  Labs ordered.  Pneumonia given. Declined flu. Needs to reschedule colonoscopy.    Relevant Orders   TSH   PSA   Lipid panel   CBC with Differential/Platelet   Comprehensive metabolic panel   Urinalysis, Routine w reflex microscopic (Completed)   Encounter for Medicare annual wellness exam       Positive depression screening       Feels like it is related to fighting with his significant other.  Declines medication.  Encouraged to follow up if symptoms worsen.    Need for vaccination against Streptococcus pneumoniae       Relevant Orders   Pneumococcal conjugate vaccine 20-valent (Prevnar 20) (Completed)        Preventative Services:  Health Risk Assessment and Personalized Prevention Plan: reviewed during visit today Bone Mass Measurements: NA CVD Screening: Done today Colon Cancer Screening: Needs to reschedule Depression Screening: Up to date Diabetes Screening: Done today  Glaucoma Screening: NA Hepatitis B vaccine: NA Hepatitis C screening: Up to date HIV Screening:Up to date Flu Vaccine: Declined Lung cancer Screening: Declined at this time Obesity Screening: Up to date Pneumonia Vaccines (2): Given today STI Screening: NA PSA screening: Done today  Discussed aspirin prophylaxis for myocardial infarction prevention and decision was it was not indicated  LABORATORY TESTING:  Health maintenance labs ordered today as discussed above.   The natural history of prostate cancer and ongoing controversy regarding screening and potential treatment outcomes of prostate cancer has been discussed with the patient. The meaning of a false positive PSA and a false negative PSA has been discussed. He indicates understanding of the limitations of this screening test and wishes to proceed with screening PSA testing.   IMMUNIZATIONS:   - Tdap: Tetanus vaccination status reviewed: Will give at future visit. - Influenza:  Refused - Pneumovax: Up to date - Prevnar: Not applicable - Zostavax vaccine: Refused  SCREENING: - Colonoscopy: Ordered today  Discussed with patient purpose of the colonoscopy is to detect colon cancer at curable precancerous or early stages   - AAA Screening: Not applicable  -Hearing Test: Not applicable  -Spirometry: Not applicable   PATIENT COUNSELING:    Sexuality: Discussed sexually transmitted diseases, partner selection, use of condoms, avoidance of unintended pregnancy  and contraceptive alternatives.   Advised to avoid cigarette smoking.  I discussed with  the patient that most people either abstain from alcohol or drink within safe limits (<=14/week and <=4 drinks/occasion for males, <=7/weeks and <= 3 drinks/occasion for females) and that the risk for alcohol disorders and other health effects rises proportionally with the number of drinks per week and how often a drinker exceeds daily limits.  Discussed cessation/primary prevention of drug use and availability of treatment for abuse.   Diet: Encouraged to adjust caloric intake to maintain  or achieve ideal body weight, to reduce intake of dietary saturated fat and total fat, to limit sodium intake by avoiding high sodium foods and not adding table salt, and to maintain adequate dietary potassium and calcium preferably from fresh fruits, vegetables, and low-fat dairy products.    stressed the importance of regular exercise  Injury prevention: Discussed safety belts, safety helmets, smoke detector, smoking near bedding or upholstery.   Dental health: Discussed importance of regular tooth brushing, flossing, and dental visits.   Follow up plan: NEXT PREVENTATIVE PHYSICAL DUE IN 1 YEAR. Return in about 1 month (around 09/09/2022) for BP Check.

## 2022-08-09 ENCOUNTER — Ambulatory Visit (INDEPENDENT_AMBULATORY_CARE_PROVIDER_SITE_OTHER): Payer: Medicare HMO | Admitting: Nurse Practitioner

## 2022-08-09 ENCOUNTER — Encounter: Payer: Self-pay | Admitting: Nurse Practitioner

## 2022-08-09 VITALS — BP 124/81 | HR 76 | Temp 97.6°F | Wt 143.8 lb

## 2022-08-09 DIAGNOSIS — E782 Mixed hyperlipidemia: Secondary | ICD-10-CM

## 2022-08-09 DIAGNOSIS — R7303 Prediabetes: Secondary | ICD-10-CM

## 2022-08-09 DIAGNOSIS — Z1331 Encounter for screening for depression: Secondary | ICD-10-CM

## 2022-08-09 DIAGNOSIS — R972 Elevated prostate specific antigen [PSA]: Secondary | ICD-10-CM | POA: Diagnosis not present

## 2022-08-09 DIAGNOSIS — Z23 Encounter for immunization: Secondary | ICD-10-CM

## 2022-08-09 DIAGNOSIS — R0602 Shortness of breath: Secondary | ICD-10-CM

## 2022-08-09 DIAGNOSIS — J449 Chronic obstructive pulmonary disease, unspecified: Secondary | ICD-10-CM | POA: Diagnosis not present

## 2022-08-09 DIAGNOSIS — Z0000 Encounter for general adult medical examination without abnormal findings: Secondary | ICD-10-CM | POA: Diagnosis not present

## 2022-08-09 DIAGNOSIS — Z125 Encounter for screening for malignant neoplasm of prostate: Secondary | ICD-10-CM | POA: Diagnosis not present

## 2022-08-09 DIAGNOSIS — Z7189 Other specified counseling: Secondary | ICD-10-CM | POA: Insufficient documentation

## 2022-08-09 DIAGNOSIS — I10 Essential (primary) hypertension: Secondary | ICD-10-CM | POA: Diagnosis not present

## 2022-08-09 LAB — URINALYSIS, ROUTINE W REFLEX MICROSCOPIC
Bilirubin, UA: NEGATIVE
Glucose, UA: NEGATIVE
Leukocytes,UA: NEGATIVE
Nitrite, UA: NEGATIVE
RBC, UA: NEGATIVE
Specific Gravity, UA: 1.025 (ref 1.005–1.030)
Urobilinogen, Ur: 1 mg/dL (ref 0.2–1.0)
pH, UA: 6 (ref 5.0–7.5)

## 2022-08-09 MED ORDER — ROSUVASTATIN CALCIUM 5 MG PO TABS: 5.0000 mg | ORAL_TABLET | Freq: Every day | ORAL | 1 refills | Status: DC

## 2022-08-09 MED ORDER — AMLODIPINE BESYLATE 10 MG PO TABS: 10.0000 mg | ORAL_TABLET | Freq: Every day | ORAL | 1 refills | Status: DC

## 2022-08-09 MED ORDER — ALBUTEROL SULFATE HFA 108 (90 BASE) MCG/ACT IN AERS: 2.0000 | INHALATION_SPRAY | Freq: Four times a day (QID) | RESPIRATORY_TRACT | 1 refills | Status: DC | PRN

## 2022-08-09 MED ORDER — SPIRIVA RESPIMAT 2.5 MCG/ACT IN AERS: 2.0000 | INHALATION_SPRAY | Freq: Every day | RESPIRATORY_TRACT | 1 refills | Status: DC

## 2022-08-09 NOTE — Assessment & Plan Note (Signed)
Labs ordered at visit today.  Will make recommendations based on lab results.   

## 2022-08-09 NOTE — Assessment & Plan Note (Signed)
Chronic.  Did not start the Crestor.  Agrees to start medication.  Side effects discussed with patient during visit.  Labs ordered today.  Follow up in 6 months.  Call sooner if concerns arise.

## 2022-08-09 NOTE — Progress Notes (Signed)
Results discussed with patient during visit.

## 2022-08-09 NOTE — Assessment & Plan Note (Signed)
Chronic.  Still elevated.  Will increase Amlodipine to 10mg .  Follow up in 1 month.  Call sooner if concerns arise.

## 2022-08-09 NOTE — Assessment & Plan Note (Addendum)
New diagnosis today.  Spirometry done in office and FEV1 70% on post Spirometry.  Will start Spiriva and albuterol given.  Instructed patient on how to properly use medications, side effects, benefits.  Follow up in 1 month for reevaluation.  Call sooner if concerns arise.  Declines CT Lung screening at this time due to transportation.

## 2022-08-09 NOTE — Assessment & Plan Note (Signed)
A voluntary discussion about advance care planning including the explanation and discussion of advance directives was extensively discussed  with the patient for 10 minutes with patient.  Explanation about the health care proxy and Living will was reviewed and packet with forms with explanation of how to fill them out was given.  During this discussion, the patient was able to identify a health care proxy as his neice and plans/does not plan to fill out the paperwork required.  Patient was offered a separate [place] visit for further assistance with forms.

## 2022-08-10 LAB — CBC WITH DIFFERENTIAL/PLATELET
Basophils Absolute: 0 10*3/uL (ref 0.0–0.2)
Basos: 1 %
EOS (ABSOLUTE): 0.1 10*3/uL (ref 0.0–0.4)
Eos: 1 %
Hematocrit: 50.1 % (ref 37.5–51.0)
Hemoglobin: 17 g/dL (ref 13.0–17.7)
Immature Grans (Abs): 0 10*3/uL (ref 0.0–0.1)
Immature Granulocytes: 0 %
Lymphocytes Absolute: 1.7 10*3/uL (ref 0.7–3.1)
Lymphs: 30 %
MCH: 29.4 pg (ref 26.6–33.0)
MCHC: 33.9 g/dL (ref 31.5–35.7)
MCV: 87 fL (ref 79–97)
Monocytes Absolute: 0.7 10*3/uL (ref 0.1–0.9)
Monocytes: 12 %
Neutrophils Absolute: 3.1 10*3/uL (ref 1.4–7.0)
Neutrophils: 56 %
Platelets: 211 10*3/uL (ref 150–450)
RBC: 5.79 x10E6/uL (ref 4.14–5.80)
RDW: 13.3 % (ref 11.6–15.4)
WBC: 5.6 10*3/uL (ref 3.4–10.8)

## 2022-08-10 LAB — COMPREHENSIVE METABOLIC PANEL
ALT: 18 IU/L (ref 0–44)
AST: 25 IU/L (ref 0–40)
Albumin/Globulin Ratio: 1.5 (ref 1.2–2.2)
Albumin: 4.4 g/dL (ref 3.9–4.9)
Alkaline Phosphatase: 93 IU/L (ref 44–121)
BUN/Creatinine Ratio: 11 (ref 10–24)
BUN: 10 mg/dL (ref 8–27)
Bilirubin Total: 0.9 mg/dL (ref 0.0–1.2)
CO2: 23 mmol/L (ref 20–29)
Calcium: 9.5 mg/dL (ref 8.6–10.2)
Chloride: 99 mmol/L (ref 96–106)
Creatinine, Ser: 0.94 mg/dL (ref 0.76–1.27)
Globulin, Total: 3 g/dL (ref 1.5–4.5)
Glucose: 169 mg/dL — ABNORMAL HIGH (ref 70–99)
Potassium: 3.9 mmol/L (ref 3.5–5.2)
Sodium: 138 mmol/L (ref 134–144)
Total Protein: 7.4 g/dL (ref 6.0–8.5)
eGFR: 89 mL/min/{1.73_m2} (ref >59)

## 2022-08-10 LAB — HEMOGLOBIN A1C
Est. average glucose Bld gHb Est-mCnc: 137 mg/dL
Hgb A1c MFr Bld: 6.4 % — ABNORMAL HIGH (ref 4.8–5.6)

## 2022-08-10 LAB — LIPID PANEL
Chol/HDL Ratio: 2.8 ratio (ref 0.0–5.0)
Cholesterol, Total: 176 mg/dL (ref 100–199)
HDL: 63 mg/dL (ref >39)
LDL Chol Calc (NIH): 95 mg/dL (ref 0–99)
Triglycerides: 101 mg/dL (ref 0–149)
VLDL Cholesterol Cal: 18 mg/dL (ref 5–40)

## 2022-08-10 LAB — TSH: TSH: 0.776 u[IU]/mL (ref 0.450–4.500)

## 2022-08-10 LAB — PSA: Prostate Specific Ag, Serum: 4.5 ng/mL — ABNORMAL HIGH (ref 0.0–4.0)

## 2022-08-12 NOTE — Addendum Note (Signed)
Addended by: Jon Billings on: 08/12/2022 08:03 AM   Modules accepted: Orders

## 2022-08-12 NOTE — Progress Notes (Signed)
Please let patient know that his lab work looks good.  His prostate cancer screening is elevated from prior.  I would like him to come back in 1 month and repeat it.  Please make him a lab appt.  His a1c shows that he is still prediabetic but close to the diabetic range.  I recommend a low carb diet.  Other lab work looks good.  No other concerns at this time.

## 2022-09-12 ENCOUNTER — Other Ambulatory Visit: Payer: Medicare HMO

## 2022-09-12 DIAGNOSIS — R972 Elevated prostate specific antigen [PSA]: Secondary | ICD-10-CM | POA: Diagnosis not present

## 2022-09-12 NOTE — Progress Notes (Deleted)
There were no vitals taken for this visit.   Subjective:    Patient ID: Mitchell Whitaker, male    DOB: 29-May-1956, 66 y.o.   MRN: [id number]  HPI: Mitchell Whitaker is a 66 y.o. male  No chief complaint on file.  HYPERTENSION {Blank single:19197::"without","with"} Chronic Kidney Disease Hypertension status: {Blank single:19197::"controlled","uncontrolled","better","worse","exacerbated","stable"}  Satisfied with current treatment? {Blank single:19197::"yes","no"} Duration of hypertension: {Blank single:19197::"chronic","months","years"} BP monitoring frequency:  {Blank single:19197::"not checking","rarely","daily","weekly","monthly","a few times a day","a few times a week","a few times a month"} BP range:  BP medication side effects:  {Blank single:19197::"yes","no"} Medication compliance: {Blank single:19197::"excellent compliance","good compliance","fair compliance","poor compliance"} Previous BP meds:{Blank [id number]::"TZGY","FVCBSWHQPR","FFMBWGYKZL/DJTTSVXBLT","JQZESPQZ","RAQTMAUQJF","HLKTGYBWLS/LHTD","SKAJGOTLXB (bystolic)","carvedilol","chlorthalidone","clonidine","diltiazem","exforge HCT","HCTZ","irbesartan (avapro)","labetalol","lisinopril","lisinopril-HCTZ","losartan (cozaar)","methyldopa","nifedipine","olmesartan (benicar)","olmesartan-HCTZ","quinapril","ramipril","spironalactone","tekturna","valsartan","valsartan-HCTZ","verapamil"} Aspirin: {Blank single:19197::"yes","no"} Recurrent headaches: {Blank single:19197::"yes","no"} Visual changes: {Blank single:19197::"yes","no"} Palpitations: {Blank single:19197::"yes","no"} Dyspnea: {Blank single:19197::"yes","no"} Chest pain: {Blank single:19197::"yes","no"} Lower extremity edema: {Blank single:19197::"yes","no"} Dizzy/lightheaded: {Blank single:19197::"yes","no"}  Relevant past medical, surgical, family and social history reviewed and updated as indicated. Interim medical history since our last visit reviewed. Allergies and  medications reviewed and updated.  Review of Systems  Per HPI unless specifically indicated above     Objective:    There were no vitals taken for this visit.  Wt Readings from Last 3 Encounters:  08/09/22 143 lb 12.8 oz (65.2 kg)  05/09/22 139 lb 3.2 oz (63.1 kg)  03/29/22 144 lb 12.8 oz (65.7 kg)    Physical Exam  Results for orders placed or performed in visit on 08/09/22  TSH  Result Value Ref Range   TSH 0.776 0.450 - 4.500 uIU/mL  PSA  Result Value Ref Range   Prostate Specific Ag, Serum 4.5 (H) 0.0 - 4.0 ng/mL  Lipid panel  Result Value Ref Range   Cholesterol, Total 176 100 - 199 mg/dL   Triglycerides 101 0 - 149 mg/dL   HDL 63 >39 mg/dL   VLDL Cholesterol Cal 18 5 - 40 mg/dL   LDL Chol Calc (NIH) 95 0 - 99 mg/dL   Chol/HDL Ratio 2.8 0.0 - 5.0 ratio  CBC with Differential/Platelet  Result Value Ref Range   WBC 5.6 3.4 - 10.8 x10E3/uL   RBC 5.79 4.14 - 5.80 x10E6/uL   Hemoglobin 17.0 13.0 - 17.7 g/dL   Hematocrit 50.1 37.5 - 51.0 %   MCV 87 79 - 97 fL   MCH 29.4 26.6 - 33.0 pg   MCHC 33.9 31.5 - 35.7 g/dL   RDW 13.3 11.6 - 15.4 %   Platelets 211 150 - 450 x10E3/uL   Neutrophils 56 Not Estab. %   Lymphs 30 Not Estab. %   Monocytes 12 Not Estab. %   Eos 1 Not Estab. %   Basos 1 Not Estab. %   Neutrophils Absolute 3.1 1.4 - 7.0 x10E3/uL   Lymphocytes Absolute 1.7 0.7 - 3.1 x10E3/uL   Monocytes Absolute 0.7 0.1 - 0.9 x10E3/uL   EOS (ABSOLUTE) 0.1 0.0 - 0.4 x10E3/uL   Basophils Absolute 0.0 0.0 - 0.2 x10E3/uL   Immature Granulocytes 0 Not Estab. %   Immature Grans (Abs) 0.0 0.0 - 0.1 x10E3/uL  Comprehensive metabolic panel  Result Value Ref Range   Glucose 169 (H) 70 - 99 mg/dL   BUN 10 8 - 27 mg/dL   Creatinine, Ser 0.94 0.76 - 1.27 mg/dL   eGFR 89 >59 mL/min/1.73   BUN/Creatinine Ratio 11 10 - 24   Sodium 138 134 - 144 mmol/L   Potassium 3.9 3.5 - 5.2 mmol/L   Chloride 99 96 - 106 mmol/L   CO2 23 20 -  29 mmol/L   Calcium 9.5 8.6 - 10.2 mg/dL    Total Protein 7.4 6.0 - 8.5 g/dL   Albumin 4.4 3.9 - 4.9 g/dL   Globulin, Total 3.0 1.5 - 4.5 g/dL   Albumin/Globulin Ratio 1.5 1.2 - 2.2   Bilirubin Total 0.9 0.0 - 1.2 mg/dL   Alkaline Phosphatase 93 44 - 121 IU/L   AST 25 0 - 40 IU/L   ALT 18 0 - 44 IU/L  Urinalysis, Routine w reflex microscopic  Result Value Ref Range   Specific Gravity, UA 1.025 1.005 - 1.030   pH, UA 6.0 5.0 - 7.5   Color, UA Yellow Yellow   Appearance Ur Clear Clear   Leukocytes,UA Negative Negative   Protein,UA Trace (A) Negative/Trace   Glucose, UA Negative Negative   Ketones, UA 1+ (A) Negative   RBC, UA Negative Negative   Bilirubin, UA Negative Negative   Urobilinogen, Ur 1.0 0.2 - 1.0 mg/dL   Nitrite, UA Negative Negative  HgB A1c  Result Value Ref Range   Hgb A1c MFr Bld 6.4 (H) 4.8 - 5.6 %   Est. average glucose Bld gHb Est-mCnc 137 mg/dL      Assessment & Plan:   Problem List Items Addressed This Visit   None    Follow up plan: No follow-ups on file.

## 2022-09-13 ENCOUNTER — Ambulatory Visit: Payer: Medicare HMO | Admitting: Nurse Practitioner

## 2022-09-13 LAB — PSA: Prostate Specific Ag, Serum: 2.6 ng/mL (ref 0.0–4.0)

## 2022-09-13 NOTE — Progress Notes (Signed)
Please let patient know that his PSA improved back to normal range.  We will monitor this in the future and if elevated again will consider a referral to Urology.

## 2023-03-15 ENCOUNTER — Other Ambulatory Visit: Payer: Self-pay | Admitting: Nurse Practitioner

## 2023-03-17 NOTE — Telephone Encounter (Signed)
Call to patient- he as been scheduled . Requested Prescriptions  Pending Prescriptions Disp Refills   amLODipine (NORVASC) 10 MG tablet [Pharmacy Med Name: AMLODIPINE BESYLATE 10MG  TABLETS] 90 tablet 1    Sig: TAKE 1 TABLET(10 MG) BY MOUTH DAILY     Cardiovascular: Calcium Channel Blockers 2 Failed - 03/15/2023  8:40 AM      Failed - Valid encounter within last 6 months    Recent Outpatient Visits           7 months ago Annual physical exam   [place] [facility] Larae Grooms, NP   10 months ago Annual physical exam   [place] [facility] Larae Grooms, NP   11 months ago Primary hypertension   [place] [facility] Larae Grooms, NP       Future Appointments             In 2 days Larae Grooms, NP [place] [facility]            Passed - Last BP in normal range    BP Readings from Last 1 Encounters:  08/09/22 124/81         Passed - Last Heart Rate in normal range    Pulse Readings from Last 1 Encounters:  08/09/22 76

## 2023-03-19 ENCOUNTER — Ambulatory Visit: Payer: Medicare HMO | Admitting: Nurse Practitioner

## 2023-03-19 NOTE — Progress Notes (Deleted)
   There were no vitals taken for this visit.   Subjective:    Patient ID: Mitchell Whitaker, male    DOB: May 26, 1956, 66 y.o.   MRN: [id number]  HPI: Mitchell Whitaker is a 67 y.o. male  No chief complaint on file.  HYPERTENSION / HYPERLIPIDEMIA Satisfied with current treatment? {Blank single:19197::"yes","no"} Duration of hypertension: {Blank single:19197::"chronic","months","years"} BP monitoring frequency: {Blank single:19197::"not checking","rarely","daily","weekly","monthly","a few times a day","a few times a week","a few times a month"} BP range:  BP medication side effects: {Blank single:19197::"yes","no"} Past BP meds: {Blank multiple:19196::"none","amlodipine","amlodipine/benazepril","atenolol","benazepril","benazepril/HCTZ","bisoprolol (bystolic)","carvedilol","chlorthalidone","clonidine","diltiazem","exforge HCT","HCTZ","irbesartan (avapro)","labetalol","lisinopril","lisinopril-HCTZ","losartan (cozaar)","methyldopa","nifedipine","olmesartan (benicar)","olmesartan-HCTZ","quinapril","ramipril","spironalactone","tekturna","valsartan","valsartan-HCTZ","verapamil"} Duration of hyperlipidemia: {Blank single:19197::"chronic","months","years"} Cholesterol medication side effects: {Blank single:19197::"yes","no"} Cholesterol supplements: {Blank multiple:19196::"none","fish oil","niacin","red yeast rice"} Past cholesterol medications: {Blank multiple:19196::"none","atorvastain (lipitor)","lovastatin (mevacor)","pravastatin (pravachol)","rosuvastatin (crestor)","simvastatin (zocor)","vytorin","fenofibrate (tricor)","gemfibrozil","ezetimide (zetia)","niaspan","lovaza"} Medication compliance: {Blank single:19197::"excellent compliance","good compliance","fair compliance","poor compliance"} Aspirin: {Blank single:19197::"yes","no"} Recent stressors: {Blank single:19197::"yes","no"} Recurrent headaches: {Blank single:19197::"yes","no"} Visual changes: {Blank single:19197::"yes","no"} Palpitations:  {Blank single:19197::"yes","no"} Dyspnea: {Blank single:19197::"yes","no"} Chest pain: {Blank single:19197::"yes","no"} Lower extremity edema: {Blank single:19197::"yes","no"} Dizzy/lightheaded: {Blank single:19197::"yes","no"}  Relevant past medical, surgical, family and social history reviewed and updated as indicated. Interim medical history since our last visit reviewed. Allergies and medications reviewed and updated.  Review of Systems  Per HPI unless specifically indicated above     Objective:    There were no vitals taken for this visit.  Wt Readings from Last 3 Encounters:  08/09/22 143 lb 12.8 oz (65.2 kg)  05/09/22 139 lb 3.2 oz (63.1 kg)  03/29/22 144 lb 12.8 oz (65.7 kg)    Physical Exam  Results for orders placed or performed in visit on 09/12/22  PSA  Result Value Ref Range   Prostate Specific Ag, Serum 2.6 0.0 - 4.0 ng/mL      Assessment & Plan:   Problem List Items Addressed This Visit       Cardiovascular and Mediastinum   Primary hypertension - Primary     Respiratory   Chronic obstructive pulmonary disease (HCC)     Other   Prediabetes   Mixed hyperlipidemia     Follow up plan: No follow-ups on file.

## 2023-03-31 ENCOUNTER — Encounter: Payer: Self-pay | Admitting: Nurse Practitioner

## 2023-03-31 ENCOUNTER — Ambulatory Visit (INDEPENDENT_AMBULATORY_CARE_PROVIDER_SITE_OTHER): Payer: Medicare HMO | Admitting: Nurse Practitioner

## 2023-03-31 VITALS — BP 138/85 | HR 75 | Temp 98.8°F | Ht 69.76 in | Wt 138.6 lb

## 2023-03-31 DIAGNOSIS — I10 Essential (primary) hypertension: Secondary | ICD-10-CM | POA: Diagnosis not present

## 2023-03-31 DIAGNOSIS — F17200 Nicotine dependence, unspecified, uncomplicated: Secondary | ICD-10-CM | POA: Diagnosis not present

## 2023-03-31 DIAGNOSIS — R7303 Prediabetes: Secondary | ICD-10-CM | POA: Diagnosis not present

## 2023-03-31 DIAGNOSIS — J449 Chronic obstructive pulmonary disease, unspecified: Secondary | ICD-10-CM | POA: Diagnosis not present

## 2023-03-31 DIAGNOSIS — E782 Mixed hyperlipidemia: Secondary | ICD-10-CM

## 2023-03-31 MED ORDER — SPIRIVA RESPIMAT 2.5 MCG/ACT IN AERS: 2.0000 | INHALATION_SPRAY | Freq: Every day | RESPIRATORY_TRACT | 1 refills | Status: DC

## 2023-03-31 MED ORDER — AMLODIPINE BESYLATE 10 MG PO TABS: ORAL_TABLET | ORAL | 1 refills | Status: DC

## 2023-03-31 MED ORDER — ALBUTEROL SULFATE HFA 108 (90 BASE) MCG/ACT IN AERS: 2.0000 | INHALATION_SPRAY | Freq: Four times a day (QID) | RESPIRATORY_TRACT | 1 refills | Status: DC | PRN

## 2023-03-31 MED ORDER — ROSUVASTATIN CALCIUM 5 MG PO TABS: 5.0000 mg | ORAL_TABLET | Freq: Every day | ORAL | 1 refills | Status: DC

## 2023-03-31 NOTE — Assessment & Plan Note (Signed)
Chronic.  Controlled.  Continue with current medication regimen of Crestor.  Labs ordered today.  Return to clinic in 6 months for reevaluation.  Call sooner if concerns arise.   

## 2023-03-31 NOTE — Assessment & Plan Note (Signed)
CT Lung scan ordered.

## 2023-03-31 NOTE — Assessment & Plan Note (Signed)
Chronic. Not well controlled.  Uses Spirva daily.  However, does not use his albuterol.  CT Lung ordered at visit today.

## 2023-03-31 NOTE — Assessment & Plan Note (Signed)
Labs ordered at visit today.  Will make recommendations based on lab results.   

## 2023-03-31 NOTE — Assessment & Plan Note (Signed)
Chronic.  Controlled.  Continue with current medication regimen of Amlodipine 10mg.  Refills sent today.  Labs ordered today.  Return to clinic in 6 months for reevaluation.  Call sooner if concerns arise.   

## 2023-03-31 NOTE — Progress Notes (Signed)
BP 138/85   Pulse 75   Temp 98.8 F (37.1 C) (Oral)   Ht 5' 9.76" (1.772 m)   Wt 138 lb 9.6 oz (62.9 kg)   SpO2 97%   BMI 20.02 kg/m    Subjective:    Patient ID: Mitchell Whitaker, male    DOB: 05-04-1956, 67 y.o.   MRN: [id number]  HPI: Mitchell Whitaker is a 67 y.o. male  Chief Complaint  Patient presents with   Medication follow up   HYPERTENSION with Chronic Kidney Disease Hypertension status: uncontrolled  Satisfied with current treatment? no Duration of hypertension: years BP monitoring frequency:  not checking BP range:  BP medication side effects:  no Medication compliance: excellent compliance Previous BP meds:amlodipine Aspirin: no Recurrent headaches: no Visual changes: no Palpitations: no Dyspnea: yes Chest pain: no Lower extremity edema: no Dizzy/lightheaded: no   COPD Spiriva in the morning. He doesn't use the albuterol very often  COPD status: uncontrolled Satisfied with current treatment?: no Oxygen use: no Dyspnea frequency: everyday Cough frequency: not very often Rescue inhaler frequency:  doesn't have one Limitation of activity: yes Productive cough:  Last Spirometry: 08/09/2022 Pneumovax: Up to Date Influenza: Not up to Date    Relevant past medical, surgical, family and social history reviewed and updated as indicated. Interim medical history since our last visit reviewed. Allergies and medications reviewed and updated.  Review of Systems  Eyes:  Negative for visual disturbance.  Respiratory:  Positive for shortness of breath. Negative for chest tightness.   Cardiovascular:  Negative for chest pain, palpitations and leg swelling.  Neurological:  Negative for dizziness, light-headedness and headaches.    Per HPI unless specifically indicated above     Objective:    BP 138/85   Pulse 75   Temp 98.8 F (37.1 C) (Oral)   Ht 5' 9.76" (1.772 m)   Wt 138 lb 9.6 oz (62.9 kg)   SpO2 97%   BMI 20.02 kg/m   Wt Readings from Last 3  Encounters:  03/31/23 138 lb 9.6 oz (62.9 kg)  08/09/22 143 lb 12.8 oz (65.2 kg)  05/09/22 139 lb 3.2 oz (63.1 kg)    Physical Exam  Results for orders placed or performed in visit on 09/12/22  PSA  Result Value Ref Range   Prostate Specific Ag, Serum 2.6 0.0 - 4.0 ng/mL      Assessment & Plan:   Problem List Items Addressed This Visit       Cardiovascular and Mediastinum   Primary hypertension    Chronic.  Controlled.  Continue with current medication regimen of Amlodipine 10mg .  Refills sent today.  Labs ordered today.  Return to clinic in 6 months for reevaluation.  Call sooner if concerns arise.        Relevant Medications   rosuvastatin (CRESTOR) 5 MG tablet   amLODipine (NORVASC) 10 MG tablet   Other Relevant Orders   Comp Met (CMET)     Respiratory   Chronic obstructive pulmonary disease (HCC)    Chronic. Not well controlled.  Uses Spirva daily.  However, does not use his albuterol.  CT Lung ordered at visit today.         Relevant Medications   Tiotropium Bromide Monohydrate (SPIRIVA RESPIMAT) 2.5 MCG/ACT AERS   albuterol (VENTOLIN HFA) 108 (90 Base) MCG/ACT inhaler     Other   Current every day smoker    CT Lung scan ordered.       Relevant Orders   Ambulatory  Referral Lung Cancer Screening [facility]   Prediabetes - Primary    Labs ordered at visit today.  Will make recommendations based on lab results.        Relevant Orders   HgB A1c   Mixed hyperlipidemia    Chronic.  Controlled.  Continue with current medication regimen of Crestor.  Labs ordered today.  Return to clinic in 6 months for reevaluation.  Call sooner if concerns arise.        Relevant Medications   rosuvastatin (CRESTOR) 5 MG tablet   amLODipine (NORVASC) 10 MG tablet   Other Relevant Orders   Lipid Profile     Follow up plan: Return in about 6 months (around 09/30/2023) for Physical and Fasting labs.

## 2023-04-01 LAB — LIPID PANEL
Chol/HDL Ratio: 2.9 ratio (ref 0.0–5.0)
Cholesterol, Total: 168 mg/dL (ref 100–199)
HDL: 58 mg/dL (ref >39)
LDL Chol Calc (NIH): 91 mg/dL (ref 0–99)
Triglycerides: 106 mg/dL (ref 0–149)
VLDL Cholesterol Cal: 19 mg/dL (ref 5–40)

## 2023-04-01 LAB — COMPREHENSIVE METABOLIC PANEL
ALT: 23 IU/L (ref 0–44)
AST: 22 IU/L (ref 0–40)
Albumin/Globulin Ratio: 1.4 (ref 1.2–2.2)
Albumin: 4.3 g/dL (ref 3.9–4.9)
Alkaline Phosphatase: 88 IU/L (ref 44–121)
BUN/Creatinine Ratio: 5 — ABNORMAL LOW (ref 10–24)
BUN: 4 mg/dL — ABNORMAL LOW (ref 8–27)
Bilirubin Total: 0.5 mg/dL (ref 0.0–1.2)
CO2: 23 mmol/L (ref 20–29)
Calcium: 9.5 mg/dL (ref 8.6–10.2)
Chloride: 102 mmol/L (ref 96–106)
Creatinine, Ser: 0.79 mg/dL (ref 0.76–1.27)
Globulin, Total: 3 g/dL (ref 1.5–4.5)
Glucose: 103 mg/dL — ABNORMAL HIGH (ref 70–99)
Potassium: 3.2 mmol/L — ABNORMAL LOW (ref 3.5–5.2)
Sodium: 142 mmol/L (ref 134–144)
Total Protein: 7.3 g/dL (ref 6.0–8.5)
eGFR: 97 mL/min/{1.73_m2} (ref >59)

## 2023-04-01 LAB — HEMOGLOBIN A1C
Est. average glucose Bld gHb Est-mCnc: 134 mg/dL
Hgb A1c MFr Bld: 6.3 % — ABNORMAL HIGH (ref 4.8–5.6)

## 2023-04-01 NOTE — Addendum Note (Signed)
Addended by: Larae Grooms on: 04/01/2023 08:05 AM   Modules accepted: Orders

## 2023-04-01 NOTE — Progress Notes (Signed)
Please let patient know that his lab work looks good.  His A1c remains in the prediabetic range.  His potassium is slightly low.  I would like him to increase his potassium intake with bananas, potatoes and oranges.  I would like him to come back in 1 month and have this repeated.  No other concerns at this time.  Follow up as discussed.

## 2023-05-05 ENCOUNTER — Other Ambulatory Visit: Payer: Medicare HMO

## 2023-08-29 ENCOUNTER — Telehealth: Payer: Self-pay | Admitting: Nurse Practitioner

## 2023-08-29 MED ORDER — AMLODIPINE BESYLATE 10 MG PO TABS: ORAL_TABLET | ORAL | 1 refills | Status: DC

## 2023-08-29 MED ORDER — ALBUTEROL SULFATE HFA 108 (90 BASE) MCG/ACT IN AERS: 2.0000 | INHALATION_SPRAY | Freq: Four times a day (QID) | RESPIRATORY_TRACT | 1 refills | Status: DC | PRN

## 2023-08-29 MED ORDER — ROSUVASTATIN CALCIUM 5 MG PO TABS: 5.0000 mg | ORAL_TABLET | Freq: Every day | ORAL | 1 refills | Status: DC

## 2023-08-29 MED ORDER — SPIRIVA RESPIMAT 2.5 MCG/ACT IN AERS: 2.0000 | INHALATION_SPRAY | Freq: Every day | RESPIRATORY_TRACT | 1 refills | Status: DC

## 2023-08-29 NOTE — Telephone Encounter (Signed)
Medication sent to the pharmacy.

## 2023-08-29 NOTE — Telephone Encounter (Signed)
Prescription Request  08/29/2023  LOV: 03/31/2023  What is the name of the medication or equipment? albuterol (VENTOLIN HFA)  amLODipine (NORVASC) 10 MG  rosuvastatin (CRESTOR) 5 MG  Tiotropium Bromide Monohydrate (SPIRIVA RESPIMAT)   Have you contacted your pharmacy to request a refill? Yes   Which pharmacy would you like this sent to?  [id number] [place], [place] - [address] AT [facility] OF [address] [place] [address] Phone: [phone] Fax: [phone]    Patient notified that their request is being sent to the clinical staff for review and that they should receive a response within 2 business days.   Please advise at [phone]

## 2023-09-11 ENCOUNTER — Ambulatory Visit: Payer: Medicare HMO | Admitting: Nurse Practitioner

## 2023-09-11 NOTE — Progress Notes (Deleted)
   There were no vitals taken for this visit.   Subjective:    Patient ID: Mitchell Whitaker, male    DOB: 09/04/1956, 67 y.o.   MRN: [id number]  HPI: Mitchell Whitaker is a 67 y.o. male  No chief complaint on file.  DIZZINESS Duration: {Blank single:19197::"days","weeks","months"} Description of symptoms: {Blank single:19197::"lightheaded","off kilter","ill-defined","room spinning","not room spinning"} Duration of episode: {Blank single:19197::"seconds","minutes","hours"} Dizziness frequency: {Blank single:19197::"no history of the same","recurrent"} Provoking factors: {Blank single:19197::"none"} Aggravating factors:  {Blank single:19197::"none"} Triggered by rolling over in bed: {Blank single:19197::"yes","no"} Triggered by bending over: {Blank single:19197::"yes","no"} Aggravated by head movement: {Blank single:19197::"yes","no"} Aggravated by exertion, coughing, loud noises: {Blank single:19197::"yes","no"} Recent head injury: {Blank single:19197::"yes","no"} Recent or current viral symptoms: {Blank single:19197::"yes","no"} History of vasovagal episodes: {Blank single:19197::"yes","no"} Nausea: {Blank single:19197::"yes","no"} Vomiting: {Blank single:19197::"yes","no"} Tinnitus: {Blank single:19197::"yes","no"} Hearing loss: {Blank single:19197::"yes","no"} Aural fullness: {Blank single:19197::"yes","no"} Headache: {Blank single:19197::"yes","no"} Photophobia/phonophobia: {Blank single:19197::"yes","no"} Unsteady gait: {Blank single:19197::"yes","no"} Postural instability: {Blank single:19197::"yes","no"} Diplopia, dysarthria, dysphagia or weakness: {Blank single:19197::"yes","no"} Related to exertion: {Blank single:19197::"yes","no"} Pallor: {Blank single:19197::"yes","no"} Diaphoresis: {Blank single:19197::"yes","no"} Dyspnea: {Blank single:19197::"yes","no"} Chest pain: {Blank single:19197::"yes","no"}  Relevant past medical, surgical, family and social history reviewed and  updated as indicated. Interim medical history since our last visit reviewed. Allergies and medications reviewed and updated.  Review of Systems  Per HPI unless specifically indicated above     Objective:    There were no vitals taken for this visit.  Wt Readings from Last 3 Encounters:  03/31/23 138 lb 9.6 oz (62.9 kg)  08/09/22 143 lb 12.8 oz (65.2 kg)  05/09/22 139 lb 3.2 oz (63.1 kg)    Physical Exam  Results for orders placed or performed in visit on 03/31/23  Comp Met (CMET)  Result Value Ref Range   Glucose 103 (H) 70 - 99 mg/dL   BUN 4 (L) 8 - 27 mg/dL   Creatinine, Ser 6.57 0.76 - 1.27 mg/dL   eGFR 97 >84 ON/GEX/5.28   BUN/Creatinine Ratio 5 (L) 10 - 24   Sodium 142 134 - 144 mmol/L   Potassium 3.2 (L) 3.5 - 5.2 mmol/L   Chloride 102 96 - 106 mmol/L   CO2 23 20 - 29 mmol/L   Calcium 9.5 8.6 - 10.2 mg/dL   Total Protein 7.3 6.0 - 8.5 g/dL   Albumin 4.3 3.9 - 4.9 g/dL   Globulin, Total 3.0 1.5 - 4.5 g/dL   Albumin/Globulin Ratio 1.4 1.2 - 2.2   Bilirubin Total 0.5 0.0 - 1.2 mg/dL   Alkaline Phosphatase 88 44 - 121 IU/L   AST 22 0 - 40 IU/L   ALT 23 0 - 44 IU/L  Lipid Profile  Result Value Ref Range   Cholesterol, Total 168 100 - 199 mg/dL   Triglycerides 413 0 - 149 mg/dL   HDL 58 >24 mg/dL   VLDL Cholesterol Cal 19 5 - 40 mg/dL   LDL Chol Calc (NIH) 91 0 - 99 mg/dL   Chol/HDL Ratio 2.9 0.0 - 5.0 ratio  HgB A1c  Result Value Ref Range   Hgb A1c MFr Bld 6.3 (H) 4.8 - 5.6 %   Est. average glucose Bld gHb Est-mCnc 134 mg/dL      Assessment & Plan:   Problem List Items Addressed This Visit   None    Follow up plan: No follow-ups on file.

## 2023-12-12 ENCOUNTER — Encounter: Payer: Self-pay | Admitting: Nurse Practitioner

## 2023-12-12 ENCOUNTER — Ambulatory Visit (INDEPENDENT_AMBULATORY_CARE_PROVIDER_SITE_OTHER): Payer: Medicare HMO | Admitting: Nurse Practitioner

## 2023-12-12 VITALS — BP 103/69 | HR 71 | Temp 97.7°F | Ht 69.75 in | Wt 129.2 lb

## 2023-12-12 DIAGNOSIS — G479 Sleep disorder, unspecified: Secondary | ICD-10-CM | POA: Diagnosis not present

## 2023-12-12 DIAGNOSIS — M25551 Pain in right hip: Secondary | ICD-10-CM

## 2023-12-12 DIAGNOSIS — R7309 Other abnormal glucose: Secondary | ICD-10-CM | POA: Diagnosis not present

## 2023-12-12 DIAGNOSIS — R0789 Other chest pain: Secondary | ICD-10-CM

## 2023-12-12 DIAGNOSIS — R972 Elevated prostate specific antigen [PSA]: Secondary | ICD-10-CM | POA: Diagnosis not present

## 2023-12-12 DIAGNOSIS — R350 Frequency of micturition: Secondary | ICD-10-CM

## 2023-12-12 LAB — URINALYSIS, ROUTINE W REFLEX MICROSCOPIC
Bilirubin, UA: NEGATIVE
Glucose, UA: NEGATIVE
Ketones, UA: NEGATIVE
Nitrite, UA: NEGATIVE
Protein,UA: NEGATIVE
RBC, UA: NEGATIVE
Specific Gravity, UA: 1.015 (ref 1.005–1.030)
Urobilinogen, Ur: 1 mg/dL (ref 0.2–1.0)
pH, UA: 6.5 (ref 5.0–7.5)

## 2023-12-12 LAB — MICROSCOPIC EXAMINATION
Bacteria, UA: NONE SEEN
RBC, Urine: NONE SEEN /[HPF] (ref 0–2)

## 2023-12-12 MED ORDER — SPIRIVA RESPIMAT 2.5 MCG/ACT IN AERS: 2.0000 | INHALATION_SPRAY | Freq: Every day | RESPIRATORY_TRACT | 1 refills | Status: DC

## 2023-12-12 MED ORDER — MELATONIN 1 MG PO TABS: 1.0000 mg | ORAL_TABLET | Freq: Every day | ORAL | 0 refills | Status: DC

## 2023-12-12 NOTE — Progress Notes (Signed)
BP 103/69   Pulse 71   Temp 97.7 F (36.5 C) (Oral)   Ht 5' 9.75" (1.772 m)   Wt 129 lb 3.2 oz (58.6 kg)   SpO2 97%   BMI 18.67 kg/m    Subjective:    Patient ID: Mitchell Whitaker, male    DOB: 1955-12-26, 68 y.o.   MRN: [id number]  HPI: Mitchell Whitaker is a 68 y.o. male  Chief Complaint  Patient presents with   Groin Pain    Patient states he has been having pain off and on pain in his groin for the last 4 to 5 years. States he does occasionally have urinary frequency.    Chest Pain    Patient states he has been having off and on chest pains. States sometimes they are sharp and sometimes it is dull.    Patient states he is having groin pain off and on for the last 4 years.  States it has been more frequent than normal.  Feels like the pain is sometimes sharp, sometimes its dull.  Sometimes it doesn't start till he starts working.  Pain is underneath his genitals.  Does have urinary frequency.  He is not having the pain in the office today.     Patient states he has been having chest pain that feels sharp.  It comes and goes.  He has been getting dizzy again.  He is about 7-8 glasses of water but has recently cut down due to having to pee a lot at night.    Patient states he is having trouble sleeping.  Does not taking anything OTC to help with symptoms.  Has never tried anything.  Would like something to help him sleep.    Relevant past medical, surgical, family and social history reviewed and updated as indicated. Interim medical history since our last visit reviewed. Allergies and medications reviewed and updated.  Review of Systems  Cardiovascular:  Positive for chest pain.  Genitourinary:  Positive for frequency.  Musculoskeletal:        Groin pain  Psychiatric/Behavioral:  Positive for sleep disturbance.     Per HPI unless specifically indicated above     Objective:    BP 103/69   Pulse 71   Temp 97.7 F (36.5 C) (Oral)   Ht 5' 9.75" (1.772 m)   Wt 129 lb 3.2  oz (58.6 kg)   SpO2 97%   BMI 18.67 kg/m   Wt Readings from Last 3 Encounters:  12/12/23 129 lb 3.2 oz (58.6 kg)  03/31/23 138 lb 9.6 oz (62.9 kg)  08/09/22 143 lb 12.8 oz (65.2 kg)    Physical Exam Vitals and nursing note reviewed.  Constitutional:      General: He is not in acute distress.    Appearance: Normal appearance. He is not ill-appearing, toxic-appearing or diaphoretic.  HENT:     Head: Normocephalic.     Right Ear: External ear normal.     Left Ear: External ear normal.     Nose: Nose normal. No congestion or rhinorrhea.     Mouth/Throat:     Mouth: Mucous membranes are moist.  Eyes:     General:        Right eye: No discharge.        Left eye: No discharge.     Extraocular Movements: Extraocular movements intact.     Conjunctiva/sclera: Conjunctivae normal.     Pupils: Pupils are equal, round, and reactive to light.  Cardiovascular:  Rate and Rhythm: Normal rate and regular rhythm.     Heart sounds: No murmur heard. Pulmonary:     Effort: Pulmonary effort is normal. No respiratory distress.     Breath sounds: Normal breath sounds. No wheezing, rhonchi or rales.  Abdominal:     General: Abdomen is flat. Bowel sounds are normal.  Musculoskeletal:     Cervical back: Normal range of motion and neck supple.     Right hip: Normal.     Left hip: Normal.  Skin:    General: Skin is warm and dry.     Capillary Refill: Capillary refill takes less than 2 seconds.  Neurological:     General: No focal deficit present.     Mental Status: He is alert and oriented to person, place, and time.  Psychiatric:        Mood and Affect: Mood normal.        Behavior: Behavior normal.        Thought Content: Thought content normal.        Judgment: Judgment normal.     Results for orders placed or performed in visit on 03/31/23  Comp Met (CMET)   Collection Time: 03/31/23  3:41 PM  Result Value Ref Range   Glucose 103 (H) 70 - 99 mg/dL   BUN 4 (L) 8 - 27 mg/dL    Creatinine, Ser 9.62 0.76 - 1.27 mg/dL   eGFR 97 >95 MW/UXL/2.44   BUN/Creatinine Ratio 5 (L) 10 - 24   Sodium 142 134 - 144 mmol/L   Potassium 3.2 (L) 3.5 - 5.2 mmol/L   Chloride 102 96 - 106 mmol/L   CO2 23 20 - 29 mmol/L   Calcium 9.5 8.6 - 10.2 mg/dL   Total Protein 7.3 6.0 - 8.5 g/dL   Albumin 4.3 3.9 - 4.9 g/dL   Globulin, Total 3.0 1.5 - 4.5 g/dL   Albumin/Globulin Ratio 1.4 1.2 - 2.2   Bilirubin Total 0.5 0.0 - 1.2 mg/dL   Alkaline Phosphatase 88 44 - 121 IU/L   AST 22 0 - 40 IU/L   ALT 23 0 - 44 IU/L  Lipid Profile   Collection Time: 03/31/23  3:41 PM  Result Value Ref Range   Cholesterol, Total 168 100 - 199 mg/dL   Triglycerides 010 0 - 149 mg/dL   HDL 58 >27 mg/dL   VLDL Cholesterol Cal 19 5 - 40 mg/dL   LDL Chol Calc (NIH) 91 0 - 99 mg/dL   Chol/HDL Ratio 2.9 0.0 - 5.0 ratio  HgB A1c   Collection Time: 03/31/23  3:41 PM  Result Value Ref Range   Hgb A1c MFr Bld 6.3 (H) 4.8 - 5.6 %   Est. average glucose Bld gHb Est-mCnc 134 mg/dL      Assessment & Plan:   Problem List Items Addressed This Visit   None Visit Diagnoses       Other chest pain    -  Primary   Relevant Orders   EKG 12-Lead        Follow up plan: No follow-ups on file.

## 2023-12-13 LAB — COMPREHENSIVE METABOLIC PANEL
ALT: 22 [IU]/L (ref 0–44)
AST: 24 [IU]/L (ref 0–40)
Albumin: 4.3 g/dL (ref 3.9–4.9)
Alkaline Phosphatase: 99 [IU]/L (ref 44–121)
BUN/Creatinine Ratio: 11 (ref 10–24)
BUN: 11 mg/dL (ref 8–27)
Bilirubin Total: 1 mg/dL (ref 0.0–1.2)
CO2: 22 mmol/L (ref 20–29)
Calcium: 9.4 mg/dL (ref 8.6–10.2)
Chloride: 101 mmol/L (ref 96–106)
Creatinine, Ser: 1 mg/dL (ref 0.76–1.27)
Globulin, Total: 3.3 g/dL (ref 1.5–4.5)
Glucose: 151 mg/dL — ABNORMAL HIGH (ref 70–99)
Potassium: 4.1 mmol/L (ref 3.5–5.2)
Sodium: 140 mmol/L (ref 134–144)
Total Protein: 7.6 g/dL (ref 6.0–8.5)
eGFR: 82 mL/min/{1.73_m2} (ref >59)

## 2023-12-13 LAB — CBC WITH DIFFERENTIAL/PLATELET
Basophils Absolute: 0 10*3/uL (ref 0.0–0.2)
Basos: 0 %
EOS (ABSOLUTE): 0.1 10*3/uL (ref 0.0–0.4)
Eos: 2 %
Hematocrit: 49.8 % (ref 37.5–51.0)
Hemoglobin: 16.4 g/dL (ref 13.0–17.7)
Immature Grans (Abs): 0 10*3/uL (ref 0.0–0.1)
Immature Granulocytes: 0 %
Lymphocytes Absolute: 2 10*3/uL (ref 0.7–3.1)
Lymphs: 35 %
MCH: 28.3 pg (ref 26.6–33.0)
MCHC: 32.9 g/dL (ref 31.5–35.7)
MCV: 86 fL (ref 79–97)
Monocytes Absolute: 0.6 10*3/uL (ref 0.1–0.9)
Monocytes: 11 %
Neutrophils Absolute: 3.1 10*3/uL (ref 1.4–7.0)
Neutrophils: 52 %
Platelets: 219 10*3/uL (ref 150–450)
RBC: 5.8 x10E6/uL (ref 4.14–5.80)
RDW: 14.9 % (ref 11.6–15.4)
WBC: 5.9 10*3/uL (ref 3.4–10.8)

## 2023-12-13 LAB — PSA: Prostate Specific Ag, Serum: 4.2 ng/mL — ABNORMAL HIGH (ref 0.0–4.0)

## 2023-12-15 NOTE — Addendum Note (Signed)
 Addended by: Larae Grooms on: 12/15/2023 08:24 AM   Modules accepted: Orders

## 2023-12-15 NOTE — Addendum Note (Signed)
 Addended by: Larae Grooms on: 12/15/2023 10:33 AM   Modules accepted: Orders

## 2023-12-16 ENCOUNTER — Other Ambulatory Visit: Payer: Self-pay

## 2023-12-16 MED ORDER — ALBUTEROL SULFATE HFA 108 (90 BASE) MCG/ACT IN AERS: 2.0000 | INHALATION_SPRAY | Freq: Four times a day (QID) | RESPIRATORY_TRACT | 1 refills | Status: DC | PRN

## 2023-12-16 NOTE — Telephone Encounter (Signed)
 Requested Prescriptions  Pending Prescriptions Disp Refills   albuterol (VENTOLIN HFA) 108 (90 Base) MCG/ACT inhaler 18 g 1    Sig: Inhale 2 puffs into the lungs every 6 (six) hours as needed for wheezing or shortness of breath.     Pulmonology:  Beta Agonists 2 Passed - 12/16/2023  4:12 PM      Passed - Last BP in normal range    BP Readings from Last 1 Encounters:  12/12/23 103/69         Passed - Last Heart Rate in normal range    Pulse Readings from Last 1 Encounters:  12/12/23 71         Passed - Valid encounter within last 12 months    Recent Outpatient Visits           8 months ago Prediabetes   [facility] Larae Grooms, NP   1 year ago Annual physical exam   [place] [facility] Larae Grooms, NP   1 year ago Annual physical exam   [facility] Larae Grooms, NP   1 year ago Primary hypertension   [place] [facility] Larae Grooms, NP

## 2023-12-17 LAB — SPECIMEN STATUS REPORT

## 2023-12-17 LAB — HEMOGLOBIN A1C
Est. average glucose Bld gHb Est-mCnc: 137 mg/dL
Hgb A1c MFr Bld: 6.4 % — ABNORMAL HIGH (ref 4.8–5.6)

## 2024-01-26 ENCOUNTER — Encounter: Payer: Self-pay | Admitting: Urology

## 2024-01-26 ENCOUNTER — Ambulatory Visit: Payer: Medicare HMO | Admitting: Urology

## 2024-03-31 ENCOUNTER — Other Ambulatory Visit: Payer: Self-pay | Admitting: Nurse Practitioner

## 2024-04-01 NOTE — Telephone Encounter (Signed)
 Requested medications are due for refill today.  yes  Requested medications are on the active medications list.  yes  Last refill. 08/29/2023 #90 1 rf  Future visit scheduled.   no  Notes to clinic.  Labs are expired.    Requested Prescriptions  Pending Prescriptions Disp Refills   rosuvastatin  (CRESTOR ) 5 MG tablet [Pharmacy Med Name: ROSUVASTATIN  5MG  TABLETS] 90 tablet 1    Sig: TAKE 1 TABLET(5 MG) BY MOUTH DAILY     Cardiovascular:  Antilipid - Statins 2 Failed - 04/01/2024 11:49 AM      Failed - Lipid Panel in normal range within the last 12 months    Cholesterol, Total  Date Value Ref Range Status  03/31/2023 168 100 - 199 mg/dL Final   LDL Chol Calc (NIH)  Date Value Ref Range Status  03/31/2023 91 0 - 99 mg/dL Final   HDL  Date Value Ref Range Status  03/31/2023 58 >39 mg/dL Final   Triglycerides  Date Value Ref Range Status  03/31/2023 106 0 - 149 mg/dL Final         Passed - Cr in normal range and within 360 days    Creatinine, Ser  Date Value Ref Range Status  12/12/2023 1.00 0.76 - 1.27 mg/dL Final         Passed - Patient is not pregnant      Passed - Valid encounter within last 12 months    Recent Outpatient Visits           3 months ago Other chest pain   [place] [facility] Aileen Alexanders, NP              Signed Prescriptions Disp Refills   amLODipine  (NORVASC ) 10 MG tablet 90 tablet 0    Sig: TAKE 1 TABLET(10 MG) BY MOUTH DAILY     Cardiovascular: Calcium  Channel Blockers 2 Passed - 04/01/2024 11:49 AM      Passed - Last BP in normal range    BP Readings from Last 1 Encounters:  12/12/23 103/69         Passed - Last Heart Rate in normal range    Pulse Readings from Last 1 Encounters:  12/12/23 71         Passed - Valid encounter within last 6 months    Recent Outpatient Visits           3 months ago Other chest pain   [facility] Aileen Alexanders, NP

## 2024-04-01 NOTE — Telephone Encounter (Signed)
 Requested Prescriptions  Pending Prescriptions Disp Refills   amLODipine  (NORVASC ) 10 MG tablet [Pharmacy Med Name: AMLODIPINE  BESYLATE 10MG TABLETS] 90 tablet 0    Sig: TAKE 1 TABLET(10 MG) BY MOUTH DAILY     Cardiovascular: Calcium  Channel Blockers 2 Passed - 04/01/2024 11:49 AM      Passed - Last BP in normal range    BP Readings from Last 1 Encounters:  12/12/23 103/69         Passed - Last Heart Rate in normal range    Pulse Readings from Last 1 Encounters:  12/12/23 71         Passed - Valid encounter within last 6 months    Recent Outpatient Visits           3 months ago Other chest pain   [place] [facility] Aileen Alexanders, NP               rosuvastatin  (CRESTOR ) 5 MG tablet [Pharmacy Med Name: ROSUVASTATIN  5MG  TABLETS] 90 tablet 1    Sig: TAKE 1 TABLET(5 MG) BY MOUTH DAILY     Cardiovascular:  Antilipid - Statins 2 Failed - 04/01/2024 11:49 AM      Failed - Lipid Panel in normal range within the last 12 months    Cholesterol, Total  Date Value Ref Range Status  03/31/2023 168 100 - 199 mg/dL Final   LDL Chol Calc (NIH)  Date Value Ref Range Status  03/31/2023 91 0 - 99 mg/dL Final   HDL  Date Value Ref Range Status  03/31/2023 58 >39 mg/dL Final   Triglycerides  Date Value Ref Range Status  03/31/2023 106 0 - 149 mg/dL Final         Passed - Cr in normal range and within 360 days    Creatinine, Ser  Date Value Ref Range Status  12/12/2023 1.00 0.76 - 1.27 mg/dL Final         Passed - Patient is not pregnant      Passed - Valid encounter within last 12 months    Recent Outpatient Visits           3 months ago Other chest pain   [place] [facility] Aileen Alexanders, NP

## 2024-04-01 NOTE — Telephone Encounter (Signed)
 Patient last seen 12/12/23

## 2024-05-07 ENCOUNTER — Other Ambulatory Visit: Payer: Self-pay | Admitting: Nurse Practitioner

## 2024-05-07 ENCOUNTER — Ambulatory Visit: Payer: Self-pay | Admitting: *Deleted

## 2024-05-07 NOTE — Telephone Encounter (Signed)
 FYI Only or Action Required?: FYI only for provider.  Patient was last seen in primary care on 12/12/2023 by Melvin Pao, NP.  Called Nurse Triage reporting No chief complaint on file..  Symptoms began yesterday.  Interventions attempted: Prescription medications: using inhalers prescribed.  Symptoms are: unchanged.  Triage Disposition: See HCP Within 4 Hours (Or PCP Triage)  Patient/caregiver understands and will follow disposition?: Yes -Calling for patient: Mitchell Whitaker(DPR)    Reason for Disposition  [1] Longstanding difficulty breathing (e.g., CHF, COPD, emphysema) AND [2] WORSE than normal  Answer Assessment - Initial Assessment Questions 1. RESPIRATORY STATUS: Describe your breathing? (e.g., wheezing, shortness of breath, unable to speak, severe coughing)      SOB with activity- outside 2. ONSET: When did this breathing problem begin?      Yesterday- noticed patient was having to rest during activity 3. PATTERN Does the difficult breathing come and go, or has it been constant since it started?      Comes and goes 4. SEVERITY: How bad is your breathing? (e.g., mild, moderate, severe)      Mild- no SOB when sitting 5. RECURRENT SYMPTOM: Have you had difficulty breathing before? If Yes, ask: When was the last time? and What happened that time?      COPD- has inhaler 6. CARDIAC HISTORY: Do you have any history of heart disease? (e.g., heart attack, angina, bypass surgery, angioplasty)      Hypertension  7. LUNG HISTORY: Do you have any history of lung disease?  (e.g., pulmonary embolus, asthma, emphysema)     COPD 8. CAUSE: What do you think is causing the breathing problem?      Not sure, out of BP medication 2 days 9. OTHER SYMPTOMS: Do you have any other symptoms? (e.g., chest pain, cough, dizziness, fever, runny nose)     no  Protocols used: Breathing Difficulty-A-AH    Copied from CRM [id number]. Topic: Clinical - Red Word Triage >> May 07, 2024 11:14 AM Mitchell Whitaker wrote: Red Word that prompted transfer to Nurse Triage: sob and fatigue. Patient is out of bp meds

## 2024-05-24 ENCOUNTER — Ambulatory Visit: Admitting: Nurse Practitioner

## 2024-05-24 NOTE — Progress Notes (Deleted)
 There were no vitals taken for this visit.   Subjective:    Patient ID: Mitchell Whitaker, male    DOB: 09-09-1956, 68 y.o.   MRN: [id number]  HPI: Mitchell Whitaker is a 68 y.o. male  No chief complaint on file.   Relevant past medical, surgical, family and social history reviewed and updated as indicated. Interim medical history since our last visit reviewed. Allergies and medications reviewed and updated.  Review of Systems  Per HPI unless specifically indicated above     Objective:    There were no vitals taken for this visit.  Wt Readings from Last 3 Encounters:  12/12/23 129 lb 3.2 oz (58.6 kg)  03/31/23 138 lb 9.6 oz (62.9 kg)  08/09/22 143 lb 12.8 oz (65.2 kg)    Physical Exam  Results for orders placed or performed in visit on 12/12/23  Microscopic Examination   Collection Time: 12/12/23 11:42 AM   Urine  Result Value Ref Range   WBC, UA 0-5 0 - 5 /hpf   RBC, Urine None seen 0 - 2 /hpf   Epithelial Cells (non renal) 0-10 0 - 10 /hpf   Bacteria, UA None seen None seen/Few  Urinalysis, Routine w reflex microscopic   Collection Time: 12/12/23 11:42 AM  Result Value Ref Range   Specific Gravity, UA 1.015 1.005 - 1.030   pH, UA 6.5 5.0 - 7.5   Color, UA Yellow Yellow   Appearance Ur Clear Clear   Leukocytes,UA Trace (A) Negative   Protein,UA Negative Negative/Trace   Glucose, UA Negative Negative   Ketones, UA Negative Negative   RBC, UA Negative Negative   Bilirubin, UA Negative Negative   Urobilinogen, Ur 1.0 0.2 - 1.0 mg/dL   Nitrite, UA Negative Negative   Microscopic Examination See below:   PSA   Collection Time: 12/12/23 11:44 AM  Result Value Ref Range   Prostate Specific Ag, Serum 4.2 (H) 0.0 - 4.0 ng/mL  Comp Met (CMET)   Collection Time: 12/12/23 11:44 AM  Result Value Ref Range   Glucose 151 (H) 70 - 99 mg/dL   BUN 11 8 - 27 mg/dL   Creatinine, Ser 8.99 0.76 - 1.27 mg/dL   eGFR 82 >40 fO/fpw/8.26   BUN/Creatinine Ratio 11 10 - 24   Sodium  140 134 - 144 mmol/L   Potassium 4.1 3.5 - 5.2 mmol/L   Chloride 101 96 - 106 mmol/L   CO2 22 20 - 29 mmol/L   Calcium  9.4 8.6 - 10.2 mg/dL   Total Protein 7.6 6.0 - 8.5 g/dL   Albumin 4.3 3.9 - 4.9 g/dL   Globulin, Total 3.3 1.5 - 4.5 g/dL   Bilirubin Total 1.0 0.0 - 1.2 mg/dL   Alkaline Phosphatase 99 44 - 121 IU/L   AST 24 0 - 40 IU/L   ALT 22 0 - 44 IU/L  CBC w/Diff   Collection Time: 12/12/23 11:44 AM  Result Value Ref Range   WBC 5.9 3.4 - 10.8 x10E3/uL   RBC 5.80 4.14 - 5.80 x10E6/uL   Hemoglobin 16.4 13.0 - 17.7 g/dL   Hematocrit 50.1 62.4 - 51.0 %   MCV 86 79 - 97 fL   MCH 28.3 26.6 - 33.0 pg   MCHC 32.9 31.5 - 35.7 g/dL   RDW 85.0 88.3 - 84.5 %   Platelets 219 150 - 450 x10E3/uL   Neutrophils 52 Not Estab. %   Lymphs 35 Not Estab. %   Monocytes 11 Not Estab. %  Eos 2 Not Estab. %   Basos 0 Not Estab. %   Neutrophils Absolute 3.1 1.4 - 7.0 x10E3/uL   Lymphocytes Absolute 2.0 0.7 - 3.1 x10E3/uL   Monocytes Absolute 0.6 0.1 - 0.9 x10E3/uL   EOS (ABSOLUTE) 0.1 0.0 - 0.4 x10E3/uL   Basophils Absolute 0.0 0.0 - 0.2 x10E3/uL   Immature Granulocytes 0 Not Estab. %   Immature Grans (Abs) 0.0 0.0 - 0.1 x10E3/uL  Hemoglobin A1c   Collection Time: 12/12/23 11:44 AM  Result Value Ref Range   Hgb A1c MFr Bld 6.4 (H) 4.8 - 5.6 %   Est. average glucose Bld gHb Est-mCnc 137 mg/dL  Specimen status report   Collection Time: 12/12/23 11:44 AM  Result Value Ref Range   specimen status report Comment       Assessment & Plan:   Problem List Items Addressed This Visit   None    Follow up plan: No follow-ups on file.

## 2024-07-05 ENCOUNTER — Ambulatory Visit: Admitting: Nurse Practitioner

## 2024-07-05 NOTE — Progress Notes (Deleted)
 There were no vitals taken for this visit.   Subjective:    Patient ID: Mitchell Whitaker, male    DOB: 09-09-1956, 67 y.o.   MRN: [id number]  HPI: Mitchell Whitaker is a 68 y.o. male  No chief complaint on file.   Relevant past medical, surgical, family and social history reviewed and updated as indicated. Interim medical history since our last visit reviewed. Allergies and medications reviewed and updated.  Review of Systems  Per HPI unless specifically indicated above     Objective:    There were no vitals taken for this visit.  Wt Readings from Last 3 Encounters:  12/12/23 129 lb 3.2 oz (58.6 kg)  03/31/23 138 lb 9.6 oz (62.9 kg)  08/09/22 143 lb 12.8 oz (65.2 kg)    Physical Exam  Results for orders placed or performed in visit on 12/12/23  Microscopic Examination   Collection Time: 12/12/23 11:42 AM   Urine  Result Value Ref Range   WBC, UA 0-5 0 - 5 /hpf   RBC, Urine None seen 0 - 2 /hpf   Epithelial Cells (non renal) 0-10 0 - 10 /hpf   Bacteria, UA None seen None seen/Few  Urinalysis, Routine w reflex microscopic   Collection Time: 12/12/23 11:42 AM  Result Value Ref Range   Specific Gravity, UA 1.015 1.005 - 1.030   pH, UA 6.5 5.0 - 7.5   Color, UA Yellow Yellow   Appearance Ur Clear Clear   Leukocytes,UA Trace (A) Negative   Protein,UA Negative Negative/Trace   Glucose, UA Negative Negative   Ketones, UA Negative Negative   RBC, UA Negative Negative   Bilirubin, UA Negative Negative   Urobilinogen, Ur 1.0 0.2 - 1.0 mg/dL   Nitrite, UA Negative Negative   Microscopic Examination See below:   PSA   Collection Time: 12/12/23 11:44 AM  Result Value Ref Range   Prostate Specific Ag, Serum 4.2 (H) 0.0 - 4.0 ng/mL  Comp Met (CMET)   Collection Time: 12/12/23 11:44 AM  Result Value Ref Range   Glucose 151 (H) 70 - 99 mg/dL   BUN 11 8 - 27 mg/dL   Creatinine, Ser 8.99 0.76 - 1.27 mg/dL   eGFR 82 >40 fO/fpw/8.26   BUN/Creatinine Ratio 11 10 - 24   Sodium  140 134 - 144 mmol/L   Potassium 4.1 3.5 - 5.2 mmol/L   Chloride 101 96 - 106 mmol/L   CO2 22 20 - 29 mmol/L   Calcium  9.4 8.6 - 10.2 mg/dL   Total Protein 7.6 6.0 - 8.5 g/dL   Albumin 4.3 3.9 - 4.9 g/dL   Globulin, Total 3.3 1.5 - 4.5 g/dL   Bilirubin Total 1.0 0.0 - 1.2 mg/dL   Alkaline Phosphatase 99 44 - 121 IU/L   AST 24 0 - 40 IU/L   ALT 22 0 - 44 IU/L  CBC w/Diff   Collection Time: 12/12/23 11:44 AM  Result Value Ref Range   WBC 5.9 3.4 - 10.8 x10E3/uL   RBC 5.80 4.14 - 5.80 x10E6/uL   Hemoglobin 16.4 13.0 - 17.7 g/dL   Hematocrit 50.1 62.4 - 51.0 %   MCV 86 79 - 97 fL   MCH 28.3 26.6 - 33.0 pg   MCHC 32.9 31.5 - 35.7 g/dL   RDW 85.0 88.3 - 84.5 %   Platelets 219 150 - 450 x10E3/uL   Neutrophils 52 Not Estab. %   Lymphs 35 Not Estab. %   Monocytes 11 Not Estab. %  Eos 2 Not Estab. %   Basos 0 Not Estab. %   Neutrophils Absolute 3.1 1.4 - 7.0 x10E3/uL   Lymphocytes Absolute 2.0 0.7 - 3.1 x10E3/uL   Monocytes Absolute 0.6 0.1 - 0.9 x10E3/uL   EOS (ABSOLUTE) 0.1 0.0 - 0.4 x10E3/uL   Basophils Absolute 0.0 0.0 - 0.2 x10E3/uL   Immature Granulocytes 0 Not Estab. %   Immature Grans (Abs) 0.0 0.0 - 0.1 x10E3/uL  Hemoglobin A1c   Collection Time: 12/12/23 11:44 AM  Result Value Ref Range   Hgb A1c MFr Bld 6.4 (H) 4.8 - 5.6 %   Est. average glucose Bld gHb Est-mCnc 137 mg/dL  Specimen status report   Collection Time: 12/12/23 11:44 AM  Result Value Ref Range   specimen status report Comment       Assessment & Plan:   Problem List Items Addressed This Visit   None    Follow up plan: No follow-ups on file.

## 2024-07-21 ENCOUNTER — Encounter: Payer: Self-pay | Admitting: Nurse Practitioner

## 2024-07-21 ENCOUNTER — Ambulatory Visit: Admitting: Nurse Practitioner

## 2024-07-21 VITALS — BP 123/79 | HR 65 | Temp 98.3°F | Ht 70.5 in | Wt 126.0 lb

## 2024-07-21 DIAGNOSIS — Z23 Encounter for immunization: Secondary | ICD-10-CM | POA: Diagnosis not present

## 2024-07-21 DIAGNOSIS — J449 Chronic obstructive pulmonary disease, unspecified: Secondary | ICD-10-CM | POA: Diagnosis not present

## 2024-07-21 DIAGNOSIS — R7303 Prediabetes: Secondary | ICD-10-CM

## 2024-07-21 DIAGNOSIS — E782 Mixed hyperlipidemia: Secondary | ICD-10-CM

## 2024-07-21 DIAGNOSIS — Z0000 Encounter for general adult medical examination without abnormal findings: Secondary | ICD-10-CM | POA: Diagnosis not present

## 2024-07-21 DIAGNOSIS — Z139 Encounter for screening, unspecified: Secondary | ICD-10-CM

## 2024-07-21 DIAGNOSIS — I10 Essential (primary) hypertension: Secondary | ICD-10-CM | POA: Diagnosis not present

## 2024-07-21 MED ORDER — SPIRIVA RESPIMAT 2.5 MCG/ACT IN AERS: 2.0000 | INHALATION_SPRAY | Freq: Every day | RESPIRATORY_TRACT | 1 refills | Status: AC

## 2024-07-21 MED ORDER — MELATONIN 1 MG PO TABS: 1.0000 mg | ORAL_TABLET | Freq: Every day | ORAL | 0 refills | Status: AC

## 2024-07-21 MED ORDER — ALBUTEROL SULFATE HFA 108 (90 BASE) MCG/ACT IN AERS: 2.0000 | INHALATION_SPRAY | Freq: Four times a day (QID) | RESPIRATORY_TRACT | 1 refills | Status: AC | PRN

## 2024-07-21 MED ORDER — ROSUVASTATIN CALCIUM 5 MG PO TABS: 5.0000 mg | ORAL_TABLET | Freq: Every day | ORAL | 1 refills | Status: AC

## 2024-07-21 MED ORDER — AMLODIPINE BESYLATE 10 MG PO TABS: ORAL_TABLET | ORAL | 0 refills | Status: AC

## 2024-07-21 NOTE — Progress Notes (Signed)
 BP 123/79   Pulse 65   Temp 98.3 F (36.8 C) (Oral)   Ht 5' 10.5 (1.791 m)   Wt 126 lb (57.2 kg)   SpO2 95%   BMI 17.82 kg/m    Subjective:    Patient ID: Mitchell Whitaker, male    DOB: 02-17-1956, 68 y.o.   MRN: [id number]  HPI: Mitchell Whitaker is a 68 y.o. male presenting on 07/21/2024 for comprehensive medical examination. Current medical complaints include:none  He currently lives with: Interim Problems from his last visit: no  HYPERTENSION / HYPERLIPIDEMIA Satisfied with current treatment? yes Duration of hypertension: years BP monitoring frequency: not checking BP range:  BP medication side effects: no Past BP meds: amlodipine  Duration of hyperlipidemia: years Cholesterol medication side effects: no Cholesterol supplements: none Past cholesterol medications: rosuvastatin  (crestor ) Medication compliance: excellent compliance Aspirin: no Recent stressors: no Recurrent headaches: no Visual changes: no Palpitations: no Dyspnea: yes Chest pain: no Lower extremity edema: no Dizzy/lightheaded: no   Depression Screen done today and results listed below:     07/21/2024    3:03 PM 03/31/2023    3:26 PM 08/09/2022   10:03 AM 05/09/2022    9:16 AM 03/29/2022    9:19 AM  Depression screen PHQ 2/9  Decreased Interest 2 1 2 2 1   Down, Depressed, Hopeless 0 1 3 0 1  PHQ - 2 Score 2 2 5 2 2   Altered sleeping 2 2 3  0 0  Tired, decreased energy 3 3 3 3 3   Change in appetite 0 1 2 2  0  Feeling bad or failure about yourself  0 0 0 0 0  Trouble concentrating 0 0 0 0 0  Moving slowly or fidgety/restless 0 0 0 0 0  Suicidal thoughts 0 0 0 0 0  PHQ-9 Score 7 8 13 7 5   Difficult doing work/chores Somewhat difficult Somewhat difficult  Somewhat difficult Somewhat difficult    The patient does not have a history of falls. I did complete a risk assessment for falls. A plan of care for falls was documented.   Past Medical History:  History reviewed. No pertinent past medical  history.  Surgical History:  History reviewed. No pertinent surgical history.  Medications:  No current outpatient medications on file prior to visit.   No current facility-administered medications on file prior to visit.    Allergies:  No Known Allergies  Social History:  Social History   Socioeconomic History   Marital status: Single    Spouse name: Not on file   Number of children: Not on file   Years of education: Not on file   Highest education level: Not on file  Occupational History   Not on file  Tobacco Use   Smoking status: Every Day    Types: Cigars   Smokeless tobacco: Never  Vaping Use   Vaping status: Every Day  Substance and Sexual Activity   Alcohol use: Yes    Comment: on occasion   Drug use: Never   Sexual activity: Yes  Other Topics Concern   Not on file  Social History Narrative   Not on file   Social Drivers of Health   Financial Resource Strain: Low Risk  (07/21/2024)   Overall Financial Resource Strain (CARDIA)    Difficulty of Paying Living Expenses: Not hard at all  Food Insecurity: Food Insecurity Present (07/21/2024)   Hunger Vital Sign    Worried About Running Out of Food in the Last Year: Sometimes true  Ran Out of Food in the Last Year: Sometimes true  Transportation Needs: Unmet Transportation Needs (07/21/2024)   PRAPARE - Transportation    Lack of Transportation (Medical): Yes    Lack of Transportation (Non-Medical): Yes  Physical Activity: Inactive (07/21/2024)   Exercise Vital Sign    Days of Exercise per Week: 0 days    Minutes of Exercise per Session: 0 min  Stress: No Stress Concern Present (07/21/2024)   [employer] of Occupational Health - Occupational Stress Questionnaire    Feeling of Stress: Not at all  Social Connections: Socially Isolated (07/21/2024)   Social Connection and Isolation Panel    Frequency of Communication with Friends and Family: Once a week    Frequency of Social Gatherings with Friends and  Family: Never    Attends Religious Services: Never    Database administrator or Organizations: No    Attends Banker Meetings: Never    Marital Status: Never married  Intimate Partner Violence: Not At Risk (07/21/2024)   Humiliation, Afraid, Rape, and Kick questionnaire    Fear of Current or Ex-Partner: No    Emotionally Abused: No    Physically Abused: No    Sexually Abused: No   Social History   Tobacco Use  Smoking Status Every Day   Types: Cigars  Smokeless Tobacco Never   Social History   Substance and Sexual Activity  Alcohol Use Yes   Comment: on occasion    Family History:  History reviewed. No pertinent family history.  Past medical history, surgical history, medications, allergies, family history and social history reviewed with patient today and changes made to appropriate areas of the chart.   Review of Systems  Eyes:  Negative for blurred vision and double vision.  Respiratory:  Positive for shortness of breath.   Cardiovascular:  Negative for chest pain, palpitations and leg swelling.  Neurological:  Negative for dizziness and headaches.   All other ROS negative except what is listed above and in the HPI.      Objective:    BP 123/79   Pulse 65   Temp 98.3 F (36.8 C) (Oral)   Ht 5' 10.5 (1.791 m)   Wt 126 lb (57.2 kg)   SpO2 95%   BMI 17.82 kg/m   Wt Readings from Last 3 Encounters:  07/21/24 126 lb (57.2 kg)  12/12/23 129 lb 3.2 oz (58.6 kg)  03/31/23 138 lb 9.6 oz (62.9 kg)    Physical Exam Vitals and nursing note reviewed.  Constitutional:      General: He is not in acute distress.    Appearance: Normal appearance. He is not ill-appearing, toxic-appearing or diaphoretic.  HENT:     Head: Normocephalic.     Right Ear: Tympanic membrane, ear canal and external ear normal.     Left Ear: Tympanic membrane, ear canal and external ear normal.     Nose: Nose normal. No congestion or rhinorrhea.     Mouth/Throat:     Mouth:  Mucous membranes are moist.  Eyes:     General:        Right eye: No discharge.        Left eye: No discharge.     Extraocular Movements: Extraocular movements intact.     Conjunctiva/sclera: Conjunctivae normal.     Pupils: Pupils are equal, round, and reactive to light.  Cardiovascular:     Rate and Rhythm: Normal rate and regular rhythm.     Heart sounds: No  murmur heard. Pulmonary:     Effort: Pulmonary effort is normal. No respiratory distress.     Breath sounds: Normal breath sounds. No wheezing, rhonchi or rales.  Abdominal:     General: Abdomen is flat. Bowel sounds are normal. There is no distension.     Palpations: Abdomen is soft.     Tenderness: There is no abdominal tenderness. There is no guarding.  Musculoskeletal:     Cervical back: Normal range of motion and neck supple.  Skin:    General: Skin is warm and dry.     Capillary Refill: Capillary refill takes less than 2 seconds.  Neurological:     General: No focal deficit present.     Mental Status: He is alert and oriented to person, place, and time.     Cranial Nerves: No cranial nerve deficit.     Motor: No weakness.     Deep Tendon Reflexes: Reflexes normal.  Psychiatric:        Mood and Affect: Mood normal.        Behavior: Behavior normal.        Thought Content: Thought content normal.        Judgment: Judgment normal.     Results for orders placed or performed in visit on 12/12/23  Microscopic Examination   Collection Time: 12/12/23 11:42 AM   Urine  Result Value Ref Range   WBC, UA 0-5 0 - 5 /hpf   RBC, Urine None seen 0 - 2 /hpf   Epithelial Cells (non renal) 0-10 0 - 10 /hpf   Bacteria, UA None seen None seen/Few  Urinalysis, Routine w reflex microscopic   Collection Time: 12/12/23 11:42 AM  Result Value Ref Range   Specific Gravity, UA 1.015 1.005 - 1.030   pH, UA 6.5 5.0 - 7.5   Color, UA Yellow Yellow   Appearance Ur Clear Clear   Leukocytes,UA Trace (A) Negative   Protein,UA Negative  Negative/Trace   Glucose, UA Negative Negative   Ketones, UA Negative Negative   RBC, UA Negative Negative   Bilirubin, UA Negative Negative   Urobilinogen, Ur 1.0 0.2 - 1.0 mg/dL   Nitrite, UA Negative Negative   Microscopic Examination See below:   PSA   Collection Time: 12/12/23 11:44 AM  Result Value Ref Range   Prostate Specific Ag, Serum 4.2 (H) 0.0 - 4.0 ng/mL  Comp Met (CMET)   Collection Time: 12/12/23 11:44 AM  Result Value Ref Range   Glucose 151 (H) 70 - 99 mg/dL   BUN 11 8 - 27 mg/dL   Creatinine, Ser 8.99 0.76 - 1.27 mg/dL   eGFR 82 >40 fO/fpw/8.26   BUN/Creatinine Ratio 11 10 - 24   Sodium 140 134 - 144 mmol/L   Potassium 4.1 3.5 - 5.2 mmol/L   Chloride 101 96 - 106 mmol/L   CO2 22 20 - 29 mmol/L   Calcium  9.4 8.6 - 10.2 mg/dL   Total Protein 7.6 6.0 - 8.5 g/dL   Albumin 4.3 3.9 - 4.9 g/dL   Globulin, Total 3.3 1.5 - 4.5 g/dL   Bilirubin Total 1.0 0.0 - 1.2 mg/dL   Alkaline Phosphatase 99 44 - 121 IU/L   AST 24 0 - 40 IU/L   ALT 22 0 - 44 IU/L  CBC w/Diff   Collection Time: 12/12/23 11:44 AM  Result Value Ref Range   WBC 5.9 3.4 - 10.8 x10E3/uL   RBC 5.80 4.14 - 5.80 x10E6/uL   Hemoglobin 16.4 13.0 -  17.7 g/dL   Hematocrit 50.1 62.4 - 51.0 %   MCV 86 79 - 97 fL   MCH 28.3 26.6 - 33.0 pg   MCHC 32.9 31.5 - 35.7 g/dL   RDW 85.0 88.3 - 84.5 %   Platelets 219 150 - 450 x10E3/uL   Neutrophils 52 Not Estab. %   Lymphs 35 Not Estab. %   Monocytes 11 Not Estab. %   Eos 2 Not Estab. %   Basos 0 Not Estab. %   Neutrophils Absolute 3.1 1.4 - 7.0 x10E3/uL   Lymphocytes Absolute 2.0 0.7 - 3.1 x10E3/uL   Monocytes Absolute 0.6 0.1 - 0.9 x10E3/uL   EOS (ABSOLUTE) 0.1 0.0 - 0.4 x10E3/uL   Basophils Absolute 0.0 0.0 - 0.2 x10E3/uL   Immature Granulocytes 0 Not Estab. %   Immature Grans (Abs) 0.0 0.0 - 0.1 x10E3/uL  Hemoglobin A1c   Collection Time: 12/12/23 11:44 AM  Result Value Ref Range   Hgb A1c MFr Bld 6.4 (H) 4.8 - 5.6 %   Est. average glucose Bld gHb  Est-mCnc 137 mg/dL  Specimen status report   Collection Time: 12/12/23 11:44 AM  Result Value Ref Range   specimen status report Comment       Assessment & Plan:   Problem List Items Addressed This Visit       Cardiovascular and Mediastinum   Primary hypertension   Chronic.  Controlled.  Continue with current medication regimen of Amlodipine  10mg .  Refills sent today.  Labs ordered today.  Return to clinic in 6 months for reevaluation.  Call sooner if concerns arise.        Relevant Medications   amLODipine  (NORVASC ) 10 MG tablet   rosuvastatin  (CRESTOR ) 5 MG tablet     Respiratory   Chronic obstructive pulmonary disease (HCC)   Chronic.  Controlled.  Continue with current medication regimen of Spiriva  and Albuterol  PRN.  Refills sent today.  Labs ordered today.  Return to clinic in 6 months for reevaluation.  Call sooner if concerns arise.        Relevant Medications   albuterol  (VENTOLIN  HFA) 108 (90 Base) MCG/ACT inhaler   Tiotropium Bromide Monohydrate  (SPIRIVA  RESPIMAT) 2.5 MCG/ACT AERS     Other   Prediabetes   Labs ordered at visit today.  Will make recommendations based on lab results.        Relevant Orders   Hemoglobin A1c   Mixed hyperlipidemia   Chronic.  Controlled.  Continue with current medication regimen.  Labs ordered today.  Return to clinic in 6 months for reevaluation.  Call sooner if concerns arise.        Relevant Medications   amLODipine  (NORVASC ) 10 MG tablet   rosuvastatin  (CRESTOR ) 5 MG tablet   Other Relevant Orders   Lipid panel   Other Visit Diagnoses       Annual physical exam    -  Primary   Health maintenance reviewed during visit today. Vaccines reviewed.  Declined colon cancer screening.   Relevant Orders   TSH   PSA   Lipid panel   CBC with Differential/Platelet   Comprehensive metabolic panel with GFR   Hemoglobin A1c     Encounter for Medicare annual wellness exam         Encounter for screening involving social  determinants of health [facility])       Relevant Orders   AMB Referral VBCI Care Management        Discussed aspirin prophylaxis for myocardial  infarction prevention and decision was it was not indicated  LABORATORY TESTING:  Health maintenance labs ordered today as discussed above.   The natural history of prostate cancer and ongoing controversy regarding screening and potential treatment outcomes of prostate cancer has been discussed with the patient. The meaning of a false positive PSA and a false negative PSA has been discussed. He indicates understanding of the limitations of this screening test and wishes to proceed with screening PSA testing.   IMMUNIZATIONS:   - Tdap: Tetanus vaccination status reviewed: Medicare - Influenza: Administered today - Pneumovax: Up to date - Prevnar: Up to date - COVID: Administered today - HPV: Not applicable - Shingrix vaccine: Discussed at visit today  SCREENING: - Colonoscopy: Declined  Discussed with patient purpose of the colonoscopy is to detect colon cancer at curable precancerous or early stages   - AAA Screening: Not applicable  -Hearing Test: Not applicable  -Spirometry: Not applicable   PATIENT COUNSELING:    Sexuality: Discussed sexually transmitted diseases, partner selection, use of condoms, avoidance of unintended pregnancy  and contraceptive alternatives.   Advised to avoid cigarette smoking.  I discussed with the patient that most people either abstain from alcohol or drink within safe limits (<=14/week and <=4 drinks/occasion for males, <=7/weeks and <= 3 drinks/occasion for females) and that the risk for alcohol disorders and other health effects rises proportionally with the number of drinks per week and how often a drinker exceeds daily limits.  Discussed cessation/primary prevention of drug use and availability of treatment for abuse.   Diet: Encouraged to adjust caloric intake to maintain  or achieve ideal body weight,  to reduce intake of dietary saturated fat and total fat, to limit sodium intake by avoiding high sodium foods and not adding table salt, and to maintain adequate dietary potassium and calcium  preferably from fresh fruits, vegetables, and low-fat dairy products.    stressed the importance of regular exercise  Injury prevention: Discussed safety belts, safety helmets, smoke detector, smoking near bedding or upholstery.   Dental health: Discussed importance of regular tooth brushing, flossing, and dental visits.   Follow up plan: NEXT PREVENTATIVE PHYSICAL DUE IN 1 YEAR. Return in about 6 months (around 01/18/2025) for HTN, HLD, DM2 FU.

## 2024-07-21 NOTE — Assessment & Plan Note (Signed)
 Chronic.  Controlled.  Continue with current medication regimen.  Labs ordered today.  Return to clinic in 6 months for reevaluation.  Call sooner if concerns arise.  ? ?

## 2024-07-21 NOTE — Progress Notes (Signed)
 Subjective:   Mitchell Whitaker is a 68 y.o. male who presents for Medicare Annual/Subsequent preventive examination.  Visit Complete: In person  Patient Medicare AWV questionnaire was completed by the patient on 07/21/24; I have confirmed that all information answered by patient is correct and no changes since this date.  Cardiac Risk Factors include: advanced age (>39men, >92 women);male gender;hypertension     Objective:    Today's Vitals   07/21/24 1449  BP: 123/79  Pulse: 65  Temp: 98.3 F (36.8 C)  TempSrc: Oral  SpO2: 95%  Weight: 126 lb (57.2 kg)  Height: 5' 10.5 (1.791 m)  PainSc: 0-No pain   Body mass index is 17.82 kg/m.     07/21/2024    2:56 PM 07/17/2018    2:53 PM  Advanced Directives  Does Patient Have a Medical Advance Directive? No No   Would patient like information on creating a medical advance directive? Yes (MAU/Ambulatory/Procedural Areas - Information given)      Data saved with a previous flowsheet row definition    Current Medications (verified) Outpatient Encounter Medications as of 07/21/2024  Medication Sig   albuterol  (VENTOLIN  HFA) 108 (90 Base) MCG/ACT inhaler Inhale 2 puffs into the lungs every 6 (six) hours as needed for wheezing or shortness of breath.   amLODipine  (NORVASC ) 10 MG tablet TAKE 1 TABLET(10 MG) BY MOUTH DAILY   rosuvastatin  (CRESTOR ) 5 MG tablet TAKE 1 TABLET(5 MG) BY MOUTH DAILY   Tiotropium Bromide Monohydrate  (SPIRIVA  RESPIMAT) 2.5 MCG/ACT AERS Inhale 2 puffs into the lungs daily.   [DISCONTINUED] melatonin 1 MG TABS tablet Take 1 tablet (1 mg total) by mouth at bedtime.   No facility-administered encounter medications on file as of 07/21/2024.    Allergies (verified) Patient has no known allergies.   History: History reviewed. No pertinent past medical history. History reviewed. No pertinent surgical history. History reviewed. No pertinent family history. Social History   Socioeconomic History   Marital  status: Single    Spouse name: Not on file   Number of children: Not on file   Years of education: Not on file   Highest education level: Not on file  Occupational History   Not on file  Tobacco Use   Smoking status: Every Day    Types: Cigars   Smokeless tobacco: Never  Vaping Use   Vaping status: Every Day  Substance and Sexual Activity   Alcohol use: Yes    Comment: on occasion   Drug use: Never   Sexual activity: Yes  Other Topics Concern   Not on file  Social History Narrative   Not on file   Social Drivers of Health   Financial Resource Strain: Low Risk  (07/21/2024)   Overall Financial Resource Strain (CARDIA)    Difficulty of Paying Living Expenses: Not hard at all  Food Insecurity: Food Insecurity Present (07/21/2024)   Hunger Vital Sign    Worried About Running Out of Food in the Last Year: Sometimes true    Ran Out of Food in the Last Year: Sometimes true  Transportation Needs: Unmet Transportation Needs (07/21/2024)   PRAPARE - Transportation    Lack of Transportation (Medical): Yes    Lack of Transportation (Non-Medical): Yes  Physical Activity: Inactive (07/21/2024)   Exercise Vital Sign    Days of Exercise per Week: 0 days    Minutes of Exercise per Session: 0 min  Stress: No Stress Concern Present (07/21/2024)   [employer] of Occupational Health - Occupational  Stress Questionnaire    Feeling of Stress: Not at all  Social Connections: Socially Isolated (07/21/2024)   Social Connection and Isolation Panel    Frequency of Communication with Friends and Family: Once a week    Frequency of Social Gatherings with Friends and Family: Never    Attends Religious Services: Never    Diplomatic Services operational officer: No    Attends Engineer, structural: Never    Marital Status: Never married    Tobacco Counseling Ready to quit: Not Answered Counseling given: Not Answered   Clinical Intake:  Pre-visit preparation completed: Yes  Pain :  No/denies pain Pain Score: 0-No pain     BMI - recorded: 17.82 Nutritional Status: BMI <19  Underweight Nutritional Risks: None Diabetes: No  How often do you need to have someone help you when you read instructions, pamphlets, or other written materials from your doctor or pharmacy?: 1 - Never What is the last grade level you completed in school?: 10th grade  Interpreter Needed?: No      Activities of Daily Living    07/21/2024    2:51 PM  In your present state of health, do you have any difficulty performing the following activities:  Hearing? 1  Vision? 1  Difficulty concentrating or making decisions? 0  Walking or climbing stairs? 0  Dressing or bathing? 0  Doing errands, shopping? 0  Preparing Food and eating ? N  Using the Toilet? N  In the past six months, have you accidently leaked urine? N  Do you have problems with loss of bowel control? N  Managing your Medications? N  Managing your Finances? N  Housekeeping or managing your Housekeeping? N    Patient Care Team: Melvin Pao, NP as PCP - General (Nurse Practitioner)  Indicate any recent Medical Services you may have received from other than [facility] providers in the past year (date may be approximate).     Assessment:   This is a routine wellness examination for Mitchell Whitaker.  Hearing/Vision screen No results found.   Goals Addressed   None    Depression Screen    07/21/2024    3:03 PM 03/31/2023    3:26 PM 08/09/2022   10:03 AM 05/09/2022    9:16 AM 03/29/2022    9:19 AM  PHQ 2/9 Scores  PHQ - 2 Score 2 2 5 2 2   PHQ- 9 Score 7 8 13 7 5     Fall Risk    07/21/2024    3:03 PM 08/09/2022   10:03 AM 05/09/2022    9:16 AM 03/29/2022    9:18 AM  Fall Risk   Falls in the past year? 1 0 0 0  Number falls in past yr: 0 0 0 0  Injury with Fall? 0 0 0 0  Risk for fall due to : No Fall Risks No Fall Risks No Fall Risks No Fall Risks  Follow up Falls evaluation completed Falls evaluation completed  Falls  evaluation completed  Falls evaluation completed      Data saved with a previous flowsheet row definition    MEDICARE RISK AT HOME: Medicare Risk at Home Any stairs in or around the home?: No If so, are there any without handrails?: No Home free of loose throw rugs in walkways, pet beds, electrical cords, etc?: Yes Adequate lighting in your home to reduce risk of falls?: Yes Life alert?: No Use of a cane, walker or w/c?: No Grab bars in the bathroom?:  Yes Shower chair or bench in shower?: No Elevated toilet seat or a handicapped toilet?: No  TIMED UP AND GO:  Was the test performed?  Yes  Length of time to ambulate 10 feet: 4 sec Gait steady and fast without use of assistive device    Cognitive Function:        07/21/2024    2:57 PM  6CIT Screen  What Year? 0 points  What month? 0 points  What time? 0 points  Count back from 20 0 points  Months in reverse 4 points  Repeat phrase 2 points  Total Score 6 points    Immunizations Immunization History  Administered Date(s) Administered   PNEUMOCOCCAL CONJUGATE-20 08/09/2022    TDAP status: Due, Education has been provided regarding the importance of this vaccine. Advised may receive this vaccine at local pharmacy or Health Dept. Aware to provide a copy of the vaccination record if obtained from local pharmacy or Health Dept. Verbalized acceptance and understanding.  Flu Vaccine status: Declined, Education has been provided regarding the importance of this vaccine but patient still declined. Advised may receive this vaccine at local pharmacy or Health Dept. Aware to provide a copy of the vaccination record if obtained from local pharmacy or Health Dept. Verbalized acceptance and understanding.  Pneumococcal vaccine status: Completed during today's visit.  Covid-19 vaccine status: Declined, Education has been provided regarding the importance of this vaccine but patient still declined. Advised may receive this vaccine at  local pharmacy or Health Dept.or vaccine clinic. Aware to provide a copy of the vaccination record if obtained from local pharmacy or Health Dept. Verbalized acceptance and understanding.  Qualifies for Shingles Vaccine? Yes   Zostavax completed No   Shingrix Completed?: No.    Education has been provided regarding the importance of this vaccine. Patient has been advised to call insurance company to determine out of pocket expense if they have not yet received this vaccine. Advised may also receive vaccine at local pharmacy or Health Dept. Verbalized acceptance and understanding.  Screening Tests Health Maintenance  Topic Date Due   Colonoscopy  Never done   COVID-19 Vaccine (1 - 2024-25 season) 08/06/2024 (Originally 06/28/2024)   Zoster Vaccines- Shingrix (1 of 2) 10/20/2024 (Originally 12/25/2004)   Influenza Vaccine  01/25/2025 (Originally 05/28/2024)   DTaP/Tdap/Td (1 - Tdap) 07/21/2025 (Originally 12/25/1973)   Medicare Annual Wellness (AWV)  07/21/2025   Pneumococcal Vaccine: 50+ Years  Completed   Hepatitis C Screening  Completed   HPV VACCINES  Aged Out   Meningococcal B Vaccine  Aged Out    Health Maintenance  Health Maintenance Due  Topic Date Due   Colonoscopy  Never done    Colorectal cancer screening: Referral to GI placed  . Pt aware the office will call re: appt.  Lung Cancer Screening: (Low Dose CT Chest recommended if Age 60-80 years, 20 pack-year currently smoking OR have quit w/in 15years.) does not qualify.    Additional Screening:  Hepatitis C Screening: does qualify; Completed 05/09/22  Vision Screening: Recommended annual ophthalmology exams for early detection of glaucoma and other disorders of the eye. Is the patient up to date with their annual eye exam?  No  Who is the provider or what is the name of the office in which the patient attends annual eye exams? N/A If pt is not established with a provider, would they like to be referred to a provider to  establish care? No .   Dental Screening: Recommended annual dental  exams for proper oral hygiene   Community Resource Referral / Chronic Care Management: CRR required this visit?  No   CCM required this visit?  No     Plan:     I have personally reviewed and noted the following in the patient's chart:   Medical and social history Use of alcohol, tobacco or illicit drugs  Current medications and supplements including opioid prescriptions. Patient is not currently taking opioid prescriptions. Functional ability and status Nutritional status Physical activity Advanced directives List of other physicians Hospitalizations, surgeries, and ER visits in previous 12 months Vitals Screenings to include cognitive, depression, and falls Referrals and appointments  In addition, I have reviewed and discussed with patient certain preventive protocols, quality metrics, and best practice recommendations. A written personalized care plan for preventive services as well as general preventive health recommendations were provided to patient.     Laymon LOISE [place], [place]   07/21/2024   After Visit Summary: (In Person-Printed) AVS printed and given to the patient

## 2024-07-21 NOTE — Assessment & Plan Note (Signed)
 Labs ordered at visit today.  Will make recommendations based on lab results.

## 2024-07-21 NOTE — Assessment & Plan Note (Signed)
 Chronic.  Controlled.  Continue with current medication regimen of Spiriva  and Albuterol  PRN.  Refills sent today.  Labs ordered today.  Return to clinic in 6 months for reevaluation.  Call sooner if concerns arise.

## 2024-07-21 NOTE — Assessment & Plan Note (Signed)
Chronic.  Controlled.  Continue with current medication regimen of Amlodipine 10mg.  Refills sent today.  Labs ordered today.  Return to clinic in 6 months for reevaluation.  Call sooner if concerns arise.   

## 2024-07-22 ENCOUNTER — Ambulatory Visit: Payer: Self-pay | Admitting: Nurse Practitioner

## 2024-07-22 LAB — COMPREHENSIVE METABOLIC PANEL WITH GFR
ALT: 15 IU/L (ref 0–44)
AST: 19 IU/L (ref 0–40)
Albumin: 4.5 g/dL (ref 3.9–4.9)
Alkaline Phosphatase: 100 IU/L (ref 47–123)
BUN/Creatinine Ratio: 11 (ref 10–24)
BUN: 10 mg/dL (ref 8–27)
Bilirubin Total: 0.8 mg/dL (ref 0.0–1.2)
CO2: 22 mmol/L (ref 20–29)
Calcium: 9.6 mg/dL (ref 8.6–10.2)
Chloride: 102 mmol/L (ref 96–106)
Creatinine, Ser: 0.93 mg/dL (ref 0.76–1.27)
Globulin, Total: 3.2 g/dL (ref 1.5–4.5)
Glucose: 95 mg/dL (ref 70–99)
Potassium: 4.2 mmol/L (ref 3.5–5.2)
Sodium: 138 mmol/L (ref 134–144)
Total Protein: 7.7 g/dL (ref 6.0–8.5)
eGFR: 89 mL/min/1.73 (ref >59)

## 2024-07-22 LAB — CBC WITH DIFFERENTIAL/PLATELET
Basophils Absolute: 0 x10E3/uL (ref 0.0–0.2)
Basos: 1 %
EOS (ABSOLUTE): 0.1 x10E3/uL (ref 0.0–0.4)
Eos: 1 %
Hematocrit: 49.7 % (ref 37.5–51.0)
Hemoglobin: 16.3 g/dL (ref 13.0–17.7)
Immature Grans (Abs): 0 x10E3/uL (ref 0.0–0.1)
Immature Granulocytes: 0 %
Lymphocytes Absolute: 1.8 x10E3/uL (ref 0.7–3.1)
Lymphs: 33 %
MCH: 28.4 pg (ref 26.6–33.0)
MCHC: 32.8 g/dL (ref 31.5–35.7)
MCV: 87 fL (ref 79–97)
Monocytes Absolute: 0.6 x10E3/uL (ref 0.1–0.9)
Monocytes: 10 %
Neutrophils Absolute: 3 x10E3/uL (ref 1.4–7.0)
Neutrophils: 55 %
Platelets: 252 x10E3/uL (ref 150–450)
RBC: 5.74 x10E6/uL (ref 4.14–5.80)
RDW: 14.4 % (ref 11.6–15.4)
WBC: 5.5 x10E3/uL (ref 3.4–10.8)

## 2024-07-22 LAB — HEMOGLOBIN A1C
Est. average glucose Bld gHb Est-mCnc: 126 mg/dL
Hgb A1c MFr Bld: 6 % — ABNORMAL HIGH (ref 4.8–5.6)

## 2024-07-22 LAB — LIPID PANEL
Chol/HDL Ratio: 3.8 ratio (ref 0.0–5.0)
Cholesterol, Total: 225 mg/dL — ABNORMAL HIGH (ref 100–199)
HDL: 60 mg/dL (ref >39)
LDL Chol Calc (NIH): 147 mg/dL — ABNORMAL HIGH (ref 0–99)
Triglycerides: 101 mg/dL (ref 0–149)
VLDL Cholesterol Cal: 18 mg/dL (ref 5–40)

## 2024-07-22 LAB — TSH: TSH: 0.972 u[IU]/mL (ref 0.450–4.500)

## 2024-07-22 LAB — PSA: Prostate Specific Ag, Serum: 4.4 ng/mL — ABNORMAL HIGH (ref 0.0–4.0)

## 2024-08-06 ENCOUNTER — Other Ambulatory Visit: Payer: Self-pay | Admitting: Nurse Practitioner

## 2024-08-09 NOTE — Telephone Encounter (Signed)
 Too soon for refill, LRF 07/21/24 for 90 days.  Requested Prescriptions  Pending Prescriptions Disp Refills   amLODipine  (NORVASC ) 10 MG tablet [Pharmacy Med Name: AMLODIPINE  BESYLATE 10MG TABLETS] 90 tablet 0    Sig: TAKE 1 TABLET(10 MG) BY MOUTH DAILY     Cardiovascular: Calcium  Channel Blockers 2 Passed - 08/09/2024  3:25 PM      Passed - Last BP in normal range    BP Readings from Last 1 Encounters:  07/21/24 123/79         Passed - Last Heart Rate in normal range    Pulse Readings from Last 1 Encounters:  07/21/24 65         Passed - Valid encounter within last 6 months    Recent Outpatient Visits           2 weeks ago Annual physical exam   [place] [facility] Melvin Pao, NP   8 months ago Other chest pain   [place] [facility] Melvin Pao, NP

## 2024-12-03 NOTE — Progress Notes (Signed)
 Mitchell Whitaker                                          MRN: [id number]   12/03/2024   The [facility] Quality Team Specialist reviewed this patient medical record for the purposes of chart review for care gap closure. The following were reviewed: chart review for care gap closure-colorectal cancer screening.    [facility] Quality Team

## 2025-01-18 ENCOUNTER — Ambulatory Visit: Admitting: Nurse Practitioner
